# Patient Record
Sex: Male | Born: 1985 | Hispanic: No | Marital: Single | Smoking: Never smoker
Health system: Southern US, Community
[De-identification: ages and names within clinical notes are randomized; demographics above are authoritative.]

## PROBLEM LIST (undated history)

## (undated) DIAGNOSIS — F419 Anxiety disorder, unspecified: Secondary | ICD-10-CM

## (undated) HISTORY — DX: Anxiety disorder, unspecified: F41.9

---

## 2009-10-04 ENCOUNTER — Ambulatory Visit: Payer: Self-pay | Admitting: Internal Medicine

## 2009-10-04 DIAGNOSIS — F411 Generalized anxiety disorder: Secondary | ICD-10-CM

## 2010-03-06 NOTE — Assessment & Plan Note (Signed)
Summary: NEW/ [employer] /NWS  #   Vital Signs:  Patient profile:   25 year old male Height:      71 inches Weight:      237 pounds BMI:     33.17 O2 Sat:      97 % on Room air Temp:     98.6 degrees F oral Pulse rate:   65 / minute Pulse rhythm:   regular BP sitting:   130 / 70  (left arm) Cuff size:   large  Vitals Entered By: Rock Nephew CMA (October 04, 2009 3:08 PM)  Nutrition Counseling: Patient's BMI is greater than 25 and therefore counseled on weight management options.  O2 Flow:  Room air  Primary Care Provider:  Etta Grandchild MD   History of Present Illness: New to me this gentleman needs a new PCP. He has been on meds for anxiety and panic for 2 years and is doing well.  Preventive Screening-Counseling & Management  Alcohol-Tobacco     Alcohol drinks/day: <1     Alcohol type: beer     >5/day in last 3 mos: no     Alcohol Counseling: not indicated; patient does not drink     Feels need to cut down: no     Feels annoyed by complaints: no     Feels guilty re: drinking: no     Needs 'eye opener' in am: no     Smoking Status: never     Tobacco Counseling: not indicated; no tobacco use  Caffeine-Diet-Exercise     Does Patient Exercise: yes  Hep-HIV-STD-Contraception     Hepatitis Risk: no risk noted     HIV Risk: no risk noted     STD Risk: no risk noted     SBE monthly: no     TSE monthly: yes     Testicular SE Education/Counseling to perform regular STE  Safety-Violence-Falls     Seat Belt Use: yes     Helmet Use: yes     Firearms in the Home: no firearms in the home     Smoke Detectors: yes      Sexual History:  currently monogamous.        Drug Use:  never and no.        Blood Transfusions:  no.    Medications Prior to Update: 1)  None  Current Medications (verified): 1)  Lexapro 10 Mg Tabs (Escitalopram Oxalate) .... Take 1 Tablet By Mouth Once A Day 2)  Clonazepam 0.5 Mg Tabs (Clonazepam) .... One By Mouth Three Times A Day As Needed For  Anxiety  Allergies (verified): No Known Drug Allergies  Past History:  Past Medical History: Anxiety  Past Surgical History: Denies surgical history  Family History: Reviewed history and no changes required. Family History of Alcoholism/Addiction Family History of Arthritis Family History High cholesterol Family History Hypertension Family History Stroke  Social History: Occupation: Contractor Single with a steady girlfriend Alcohol use-yes Drug use-no Regular exercise-yes Drug Use:  never, no Does Patient Exercise:  yes Education:  Environmental manager Use:  yes Smoking Status:  never Hepatitis Risk:  no risk noted HIV Risk:  no risk noted STD Risk:  no risk noted Sexual History:  currently monogamous Blood Transfusions:  no  Review of Systems  The patient denies chest pain, syncope, dyspnea on exertion, peripheral edema, prolonged cough, headaches, hemoptysis, abdominal pain, and depression.    Physical Exam  General:  alert, well-developed, well-nourished, well-hydrated,  appropriate dress, normal appearance, healthy-appearing, and cooperative to examination.   Head:  normocephalic, atraumatic, no abnormalities observed, and no abnormalities palpated.   Eyes:  vision grossly intact, pupils equal, pupils round, and pupils reactive to light.   Mouth:  Oral mucosa and oropharynx without lesions or exudates.  Teeth in good repair. Neck:  supple, full ROM, no masses, no thyromegaly, no thyroid nodules or tenderness, no JVD, normal carotid upstroke, no carotid bruits, no cervical lymphadenopathy, and no neck tenderness.   Lungs:  normal respiratory effort, no intercostal retractions, no accessory muscle use, normal breath sounds, no dullness, no fremitus, no crackles, and no wheezes.   Heart:  normal rate, regular rhythm, no murmur, no gallop, no rub, and no JVD.   Abdomen:  soft, non-tender, normal bowel sounds, no distention, no masses, no guarding, no  rigidity, no rebound tenderness, no abdominal hernia, no inguinal hernia, no hepatomegaly, and no splenomegaly.   Msk:  No deformity or scoliosis noted of thoracic or lumbar spine.   Pulses:  R and L carotid,radial,femoral,dorsalis pedis and posterior tibial pulses are full and equal bilaterally Extremities:  No clubbing, cyanosis, edema, or deformity noted with normal full range of motion of all joints.   Neurologic:  No cranial nerve deficits noted. Station and gait are normal. Plantar reflexes are down-going bilaterally. DTRs are symmetrical throughout. Sensory, motor and coordinative functions appear intact. Skin:  Intact without suspicious lesions or rashes Cervical Nodes:  no anterior cervical adenopathy and no posterior cervical adenopathy.   Axillary Nodes:  no R axillary adenopathy and no L axillary adenopathy.   Inguinal Nodes:  no R inguinal adenopathy and no L inguinal adenopathy.   Psych:  Oriented X3, memory intact for recent and remote, normally interactive, good eye contact, not depressed appearing, not agitated, not suicidal, not homicidal, and slightly anxious.     Impression & Recommendations:  Problem # 1:  ANXIETY (ICD-300.00) Assessment Unchanged  he was referred to Vernell Leep for Psychotherapy to deal with anxiety, performance issues, and panic. His updated medication list for this problem includes:    Lexapro 10 Mg Tabs (Escitalopram oxalate) .Marland Kitchen... Take 1 tablet by mouth once a day    Clonazepam 0.5 Mg Tabs (Clonazepam) ..... One by mouth three times a day as needed for anxiety  Discussed medication use and relaxation techniques.   Complete Medication List: 1)  Lexapro 10 Mg Tabs (Escitalopram oxalate) .... Take 1 tablet by mouth once a day 2)  Clonazepam 0.5 Mg Tabs (Clonazepam) .... One by mouth three times a day as needed for anxiety  Patient Instructions: 1)  Please schedule a follow-up appointment in 6 months. Prescriptions: CLONAZEPAM 0.5 MG TABS  (CLONAZEPAM) one by mouth three times a day as needed for anxiety  #90 x 5   Entered and Authorized by:   Etta Grandchild MD   Signed by:   Etta Grandchild MD on 10/04/2009   Method used:   Print then Give to Patient   RxID:   [id number] LEXAPRO 10 MG TABS (ESCITALOPRAM OXALATE) Take 1 tablet by mouth once a day  #30 x 11   Entered and Authorized by:   Etta Grandchild MD   Signed by:   Etta Grandchild MD on 10/04/2009   Method used:   Print then Give to Patient   RxID:   [id number]   Preventive Care Screening  Last Tetanus Booster:    Date:  02/05/2007    Results:  Historical

## 2010-03-08 ENCOUNTER — Telehealth: Payer: Self-pay | Admitting: Internal Medicine

## 2010-03-14 NOTE — Progress Notes (Signed)
Summary: RF  Phone Note Refill Request   Refills Requested: Medication #1:  CLONAZEPAM 0.5 MG TABS one by mouth three times a day as needed for anxiety. Next office visit is 03/30/10  Initial call taken by: Lamar Sprinkles, CMA,  March 08, 2010 2:32 PM    Prescriptions: CLONAZEPAM 0.5 MG TABS (CLONAZEPAM) one by mouth three times a day as needed for anxiety  #90 x 0   Entered and Authorized by:   Etta Grandchild MD   Signed by:   Etta Grandchild MD on 03/08/2010   Method used:   Historical   RxID:   [id number]  Rx called in to [facility] [place] [phone]/la

## 2010-03-30 ENCOUNTER — Other Ambulatory Visit: Payer: BC Managed Care – PPO

## 2010-03-30 ENCOUNTER — Encounter: Payer: Self-pay | Admitting: Internal Medicine

## 2010-03-30 ENCOUNTER — Other Ambulatory Visit: Payer: Self-pay | Admitting: Internal Medicine

## 2010-03-30 ENCOUNTER — Encounter (INDEPENDENT_AMBULATORY_CARE_PROVIDER_SITE_OTHER): Payer: BC Managed Care – PPO | Admitting: Internal Medicine

## 2010-03-30 DIAGNOSIS — Z0000 Encounter for general adult medical examination without abnormal findings: Secondary | ICD-10-CM

## 2010-03-30 DIAGNOSIS — T783XXA Angioneurotic edema, initial encounter: Secondary | ICD-10-CM | POA: Insufficient documentation

## 2010-03-30 DIAGNOSIS — F411 Generalized anxiety disorder: Secondary | ICD-10-CM

## 2010-03-30 LAB — CBC WITH DIFFERENTIAL/PLATELET
Basophils Absolute: 0 10*3/uL (ref 0.0–0.1)
Eosinophils Absolute: 0.4 10*3/uL (ref 0.0–0.7)
Eosinophils Relative: 6.6 % — ABNORMAL HIGH (ref 0.0–5.0)
HCT: 49 % (ref 39.0–52.0)
Lymphs Abs: 1.7 10*3/uL (ref 0.7–4.0)
MCHC: 34.9 g/dL (ref 30.0–36.0)
MCV: 91.4 fl (ref 78.0–100.0)
Monocytes Absolute: 0.6 10*3/uL (ref 0.1–1.0)
Neutrophils Relative %: 57 % (ref 43.0–77.0)
Platelets: 171 10*3/uL (ref 150.0–400.0)
RDW: 13.7 % (ref 11.5–14.6)
WBC: 6.4 10*3/uL (ref 4.5–10.5)

## 2010-03-30 LAB — HEPATIC FUNCTION PANEL
AST: 25 U/L (ref 0–37)
Albumin: 4 g/dL (ref 3.5–5.2)
Total Bilirubin: 1.9 mg/dL — ABNORMAL HIGH (ref 0.3–1.2)

## 2010-03-30 LAB — BASIC METABOLIC PANEL
BUN: 11 mg/dL (ref 6–23)
Chloride: 107 mEq/L (ref 96–112)
Creatinine, Ser: 1.2 mg/dL (ref 0.4–1.5)
Glucose, Bld: 89 mg/dL (ref 70–99)
Potassium: 4.9 mEq/L (ref 3.5–5.1)

## 2010-03-30 LAB — CONVERTED CEMR LAB: IgE (Immunoglobulin E), Serum: 41.9 intl units/mL (ref 0.0–180.0)

## 2010-03-30 LAB — LIPID PANEL
Cholesterol: 176 mg/dL (ref 0–200)
LDL Cholesterol: 109 mg/dL — ABNORMAL HIGH (ref 0–99)
Triglycerides: 66 mg/dL (ref 0.0–149.0)

## 2010-03-30 LAB — TSH: TSH: 1.57 u[IU]/mL (ref 0.35–5.50)

## 2010-04-03 NOTE — Assessment & Plan Note (Signed)
Summary: PHYSICAL / TO COME FASTING /NWS  #   Vital Signs:  Patient profile:   25 year old male Height:      71 inches (180.34 cm) Weight:      255.13 pounds (115.97 kg) BMI:     35.71 O2 Sat:      97 % on Room air Temp:     97.2 degrees F (36.22 degrees C) oral Pulse rate:   80 / minute Pulse rhythm:   regular Resp:     14 per minute BP sitting:   138 / 70  (left arm) Cuff size:   large  Vitals Entered By: Burnard Leigh CMA(AAMA) (March 30, 2010 9:17 AM)  Nutrition Counseling: Patient's BMI is greater than 25 and therefore counseled on weight management options.  O2 Flow:  Room air CC: CPE/Wellness Visit/sls,cma Is Patient Diabetic? No Pain Assessment Patient in pain? no      Comments Pt would like to discuss referral to Allergist for shellfish testing. Pt would like to discuss taking MV w/meds.   Primary Care Provider:  Etta Grandchild MD  CC:  CPE/Wellness Visit/sls and cma.  History of Present Illness: He returns for a complete physical but asks that I test him for shellfish allergy - he tells me a story about eating some crab 4 years ago and developing mouth, lip, tongue swelling and having some episodes of hives after eating shrimp.  Preventive Screening-Counseling & Management  Alcohol-Tobacco     Alcohol drinks/day: <1     Alcohol type: beer     >5/day in last 3 mos: no     Alcohol Counseling: not indicated; patient does not drink     Feels need to cut down: no     Feels annoyed by complaints: no     Feels guilty re: drinking: no     Needs 'eye opener' in am: no     Smoking Status: never     Tobacco Counseling: not indicated; no tobacco use  Hep-HIV-STD-Contraception     Hepatitis Risk: no risk noted     HIV Risk: no risk noted     STD Risk: no risk noted     SBE monthly: no     TSE monthly: yes     Testicular SE Education/Counseling to perform regular STE      Sexual History:  currently monogamous.        Drug Use:  never and no.     Blood Transfusions:  no.    Clinical Review Panels:  Immunizations   Last Tetanus Booster:  Historical (02/05/2007)   Medications Prior to Update: 1)  Lexapro 10 Mg Tabs (Escitalopram Oxalate) .... Take 1 Tablet By Mouth Once A Day 2)  Clonazepam 0.5 Mg Tabs (Clonazepam) .... One By Mouth Three Times A Day As Needed For Anxiety  Current Medications (verified): 1)  Clonazepam 0.5 Mg Tabs (Clonazepam) .... One By Mouth Three Times A Day As Needed For Anxiety  Allergies (verified): No Known Drug Allergies  Past History:  Past Medical History: Last updated: 10/04/2009 Anxiety  Past Surgical History: Last updated: 10/04/2009 Denies surgical history  Family History: Last updated: 10/04/2009 Family History of Alcoholism/Addiction Family History of Arthritis Family History High cholesterol Family History Hypertension Family History Stroke  Social History: Last updated: 10/04/2009 Occupation: supplier relations specialist Single with a steady girlfriend Alcohol use-yes Drug use-no Regular exercise-yes  Risk Factors: Alcohol Use: <1 (03/30/2010) >5 drinks/d w/in last 3 months: no (03/30/2010) Exercise: yes (10/04/2009)  Risk Factors: Smoking Status: never (03/30/2010)  Family History: Reviewed history from 10/04/2009 and no changes required. Family History of Alcoholism/Addiction Family History of Arthritis Family History High cholesterol Family History Hypertension Family History Stroke  Social History: Reviewed history from 10/04/2009 and no changes required. 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  The patient denies anorexia, fever, weight loss, chest pain, syncope, dyspnea on exertion, peripheral edema, prolonged cough, headaches, hemoptysis, abdominal pain, hematuria, suspicious skin lesions, depression,  enlarged lymph nodes, and testicular masses.   Derm:  Denies changes in color of skin, changes in nail beds, dryness, excessive perspiration, flushing, itching, and rash. Psych:  Complains of anxiety; denies alternate hallucination ( auditory/visual), depression, easily angered, easily tearful, irritability, mental problems, panic attacks, sense of great danger, suicidal thoughts/plans, thoughts of violence, unusual visions or sounds, and thoughts /plans of harming others.  Physical Exam  General:  alert, well-developed, well-nourished, well-hydrated, appropriate dress, normal appearance, healthy-appearing, and cooperative to examination.   Head:  normocephalic, atraumatic, no abnormalities observed, and no abnormalities palpated.   Eyes:  vision grossly intact, pupils equal, pupils round, and pupils reactive to light.   Mouth:  Oral mucosa and oropharynx without lesions or exudates.  Teeth in good repair. Neck:  supple, full ROM, no masses, no thyromegaly, no thyroid nodules or tenderness, no JVD, normal carotid upstroke, no carotid bruits, no cervical lymphadenopathy, and no neck tenderness.   Lungs:  normal respiratory effort, no intercostal retractions, no accessory muscle use, normal breath sounds, no dullness, no fremitus, no crackles, and no wheezes.   Heart:  normal rate, regular rhythm, no murmur, no gallop, no rub, and no JVD.   Abdomen:  soft, non-tender, normal bowel sounds, no distention, no masses, no guarding, no rigidity, no rebound tenderness, no abdominal hernia, no inguinal hernia, no hepatomegaly, and no splenomegaly.   Genitalia:  uncircumcised, no hydrocele, no varicocele, no scrotal masses, no testicular masses or atrophy, no cutaneous lesions, and no urethral discharge.   Msk:  No deformity or scoliosis noted of thoracic or lumbar spine.   Pulses:  R and L carotid,radial,femoral,dorsalis pedis and posterior tibial pulses are full and equal bilaterally Extremities:  No  clubbing, cyanosis, edema, or deformity noted with normal full range of motion of all joints.   Neurologic:  No cranial nerve deficits noted. Station and gait are normal. Plantar reflexes are down-going bilaterally. DTRs are symmetrical throughout. Sensory, motor and coordinative functions appear intact. Skin:  Intact without suspicious lesions or rashes Cervical Nodes:  no anterior cervical adenopathy and no posterior cervical adenopathy.   Axillary Nodes:  no R axillary adenopathy and no L axillary adenopathy.   Psych:  Oriented X3, memory intact for recent and remote, normally interactive, good eye contact, not anxious appearing, not depressed appearing, not agitated, not suicidal, and not homicidal.     Impression & Recommendations:  Problem # 1:  ANGIONEUROTIC EDEMA (ICD-995.1) Assessment New  Orders: Venipuncture (04540) T-Food Allergy Profile Specific IgE (86003/[address]) TLB-Lipid Panel (80061-LIPID) TLB-BMP (Basic Metabolic Panel-BMET) (80048-METABOL) TLB-CBC Platelet - w/Differential (85025-CBCD) TLB-TSH (Thyroid Stimulating Hormone) (84443-TSH) TLB-Hepatic/Liver Function Pnl (80076-HEPATIC)  Problem # 2:  ROUTINE GENERAL MEDICAL EXAM@HEALTH  CARE FACL (ICD-V70.0) Assessment: New  Orders: Venipuncture (98119) T-Food Allergy Profile Specific IgE (86003/[address]) TLB-Lipid Panel (80061-LIPID) TLB-BMP (Basic Metabolic Panel-BMET) (80048-METABOL) TLB-CBC Platelet - w/Differential (85025-CBCD) TLB-TSH (Thyroid Stimulating Hormone) (84443-TSH) TLB-Hepatic/Liver  Function Pnl (80076-HEPATIC)  Td Booster: Historical (02/05/2007)    Discussed using sunscreen, use of alcohol, drug use, self testicular exam, routine dental care, routine eye care, routine physical exam, seat belts, multiple vitamins,  and recommendations for immunizations.  Discussed exercise and checking cholesterol.  Discussed gun safety, safe sex, and contraception.   Problem # 3:  ANXIETY  (ICD-300.00) Assessment: Improved  The following medications were removed from the medication list:    Lexapro 10 Mg Tabs (Escitalopram oxalate) .Marland Kitchen... Take 1 tablet by mouth once a day His updated medication list for this problem includes:    Clonazepam 0.5 Mg Tabs (Clonazepam) ..... One by mouth three times a day as needed for anxiety  Discussed medication use and relaxation techniques.   Complete Medication List: 1)  Clonazepam 0.5 Mg Tabs (Clonazepam) .... One by mouth three times a day as needed for anxiety  Patient Instructions: 1)  Please schedule a follow-up appointment as needed. 2)  It is important that you exercise regularly at least 20 minutes 5 times a week. If you develop chest pain, have severe difficulty breathing, or feel very tired , stop exercising immediately and seek medical attention. 3)  You need to lose weight. Consider a lower calorie diet and regular exercise.  4)  If you could be exposed to sexually transmitted diseases, you should use a condom. Prescriptions: CLONAZEPAM 0.5 MG TABS (CLONAZEPAM) one by mouth three times a day as needed for anxiety  #90 x 5   Entered and Authorized by:   Etta Grandchild MD   Signed by:   Etta Grandchild MD on 03/30/2010   Method used:   Print then Give to Patient   RxID:   [id number]    Orders Added: 1)  Venipuncture [01027] 2)  T-Food Allergy Profile Specific IgE [86003/[address]] 3)  TLB-Lipid Panel [80061-LIPID] 4)  TLB-BMP (Basic Metabolic Panel-BMET) [80048-METABOL] 5)  TLB-CBC Platelet - w/Differential [85025-CBCD] 6)  TLB-TSH (Thyroid Stimulating Hormone) [84443-TSH] 7)  TLB-Hepatic/Liver Function Pnl [80076-HEPATIC] 8)  Est. Patient 18-39 years [99395] 9)  Est. Patient Level III [25366]

## 2010-04-03 NOTE — Letter (Signed)
Summary: Lipid Letter  [facility]  [address] [place], [place] [address]   Phone: [phone]  Fax: [phone]    03/30/2010  Robert Werner [address] [place], [place]  [address]  Dear Robert Werner:  We have carefully reviewed your last lipid profile from 03/30/2010 and the results are noted below with a summary of recommendations for lipid management.    Cholesterol:       176     Goal: <200   HDL "good" Cholesterol:   56.43     Goal: >40   LDL "bad" Cholesterol:   109     Goal: <130   Triglycerides:       66.0     Goal: <150    EXCELLENT RESULTS, food allergy tests are not back yet.    TLC Diet (Therapeutic Lifestyle Change): Saturated Fats & Transfatty acids should be kept < 7% of total calories ***Reduce Saturated Fats Polyunstaurated Fat can be up to 10% of total calories Monounsaturated Fat Fat can be up to 20% of total calories Total Fat should be no greater than 25-35% of total calories Carbohydrates should be 50-60% of total calories Protein should be approximately 15% of total calories Fiber should be at least 20-30 grams a day ***Increased fiber may help lower LDL Total Cholesterol should be < 200mg /day Consider adding plant stanol/sterols to diet (example: Benacol spread) ***A higher intake of unsaturated fat may reduce Triglycerides and Increase HDL    Adjunctive Measures (may lower LIPIDS and reduce risk of Heart Attack) include: Aerobic Exercise (20-30 minutes 3-4 times a week) Limit Alcohol Consumption Weight Reduction Aspirin 75-81 mg a day by mouth (if not allergic or contraindicated) Dietary Fiber 20-30 grams a day by mouth     Current Medications: 1)    Clonazepam 0.5 Mg Tabs (Clonazepam) .... One by mouth three times a day as needed for anxiety  If you have any questions, please call. We appreciate being able to work with you.   Sincerely,    [facility] Etta Grandchild MD

## 2010-05-14 ENCOUNTER — Telehealth: Payer: Self-pay | Admitting: *Deleted

## 2010-05-14 NOTE — Telephone Encounter (Signed)
It is ok for an early refill

## 2010-05-14 NOTE — Telephone Encounter (Signed)
Called pharmacy and gave PO per Dr Yetta Barre for early refill authorization. Pt informed.

## 2010-05-14 NOTE — Telephone Encounter (Signed)
Pt is not due for RF of clonazepam until Saturday. He left vm req early refill. Pt is out of med now due to taking more than tid b/c of increased stress in his life/work. Please advise.

## 2010-05-22 ENCOUNTER — Other Ambulatory Visit: Payer: Self-pay | Admitting: Internal Medicine

## 2010-05-23 ENCOUNTER — Other Ambulatory Visit: Payer: Self-pay | Admitting: Internal Medicine

## 2010-06-22 ENCOUNTER — Telehealth: Payer: Self-pay | Admitting: *Deleted

## 2010-06-22 ENCOUNTER — Other Ambulatory Visit: Payer: Self-pay | Admitting: Internal Medicine

## 2010-06-22 MED ORDER — CLONAZEPAM 0.5 MG PO TABS: 0.5000 mg | ORAL_TABLET | Freq: Three times a day (TID) | ORAL | Status: DC | PRN

## 2010-06-22 NOTE — Telephone Encounter (Signed)
Pt told me RX for klonopin written at [facility] on 2/24 by dr Yetta Barre (w/5rfs) was dropped off at [facility]. [facility] and [facility] have no record of the 2/24 rx for klonpin. He says he uses NO other pharmacies and did drop off the RX at [facility].   Patient requesting to know if MD would authorize at least enough to get thru Monday to wait on response from PCP?

## 2010-06-22 NOTE — Telephone Encounter (Signed)
Patient requesting RF of klonopin 0.5 tid prn. Please advise. (Last OV 03/2010)

## 2010-06-22 NOTE — Telephone Encounter (Signed)
Printed RX shredded - Called into pharm. Patient informed and advised to f/u with Dr Yetta Barre

## 2010-06-22 NOTE — Telephone Encounter (Signed)
Will call pharm - Pt aware we are working on this.

## 2010-06-22 NOTE — Telephone Encounter (Signed)
[facility] shows last rx for One months and 5 refills just done feb 24  Too soon for refills

## 2010-06-22 NOTE — Telephone Encounter (Signed)
I reviewed the [place] controlled substance database  The pt's statements appear to be authentic  Will go ahead with rx refill

## 2010-07-10 ENCOUNTER — Encounter: Payer: Self-pay | Admitting: Internal Medicine

## 2010-07-11 ENCOUNTER — Encounter: Payer: Self-pay | Admitting: Internal Medicine

## 2010-07-11 ENCOUNTER — Ambulatory Visit (INDEPENDENT_AMBULATORY_CARE_PROVIDER_SITE_OTHER): Payer: BC Managed Care – PPO | Admitting: Internal Medicine

## 2010-07-11 VITALS — BP 126/70 | HR 56 | Temp 98.6°F | Resp 16 | Wt 244.0 lb

## 2010-07-11 DIAGNOSIS — F411 Generalized anxiety disorder: Secondary | ICD-10-CM

## 2010-07-11 MED ORDER — CLONAZEPAM 0.5 MG PO TABS: 0.5000 mg | ORAL_TABLET | Freq: Three times a day (TID) | ORAL | Status: DC | PRN

## 2010-07-11 NOTE — Patient Instructions (Signed)
Anxiety and Panic Attacks Your caregiver has informed you that you are having an anxiety or panic attack. There may be many forms of this. Most of the time these attacks come suddenly and without warning. They come at any time of day, including periods of sleep, and at any time of life. They may be strong and unexplained. Although panic attacks are very scary, they are physically harmless. Sometimes the cause of your anxiety is not known. Anxiety is a protective mechanism of the body in its fight or flight mechanism. Most of these perceived danger situations are actually nonphysical situations (such as anxiety over losing a job). CAUSES The causes of an anxiety or panic attack are many. Panic attacks may occur in otherwise healthy people given a certain set of circumstances. There may be a genetic cause for panic attacks. Some medications may also have anxiety as a side effect. SYMPTOMS Some of the most common feelings are:  Intense terror.  Dizziness, feeling faint.   Hot and cold flashes.   Fear of going crazy.   Feelings that nothing is real.   Sweating.   Shaking.   Chest pain or a fast heartbeat (palpitations).  Smothering, choking sensations.   Feelings of impending doom and that death is near.   Tingling of extremities, this may be from over breathing.   Altered reality (derealization).   Being detached from yourself (depersonalization).   Several symptoms can be present to make up anxiety or panic attacks. DIAGNOSIS The evaluation by your caregiver will depend on the type of symptoms you are experiencing. The diagnosis of anxiety or pain attack is made when no physical illness can be determined to be a cause of the symptoms. TREATMENT Treatment to prevent anxiety and panic attacks may include:  Avoidance of circumstances that cause anxiety.   Reassurance and relaxation.   Regular exercise.   Relaxation therapies, such as yoga.   Psychotherapy with a psychiatrist  or therapist.   Avoidance of caffeine, alcohol and illegal drugs.   Prescribed medication.  SEEK IMMEDIATE MEDICAL CARE IF:  You experience panic attack symptoms that are different than your usual symptoms.   You have any worsening or concerning symptoms.  Document Released: 01/21/2005 Document Re-Released: 07/11/2009 Exit[employer] Patient Information 2011 [employer]. 

## 2010-07-11 NOTE — Assessment & Plan Note (Addendum)
Continue current meds, it sounds like it is working well for him

## 2010-07-11 NOTE — Progress Notes (Signed)
  Subjective:    Patient ID: Robert Werner, male    DOB: 1985/07/04, 25 y.o.   MRN: [id number]  Anxiety Presents for follow-up visit. Symptoms include insomnia, muscle tension, nervous/anxious behavior and panic. Patient reports no chest pain, compulsions, confusion, decreased concentration, depressed mood, dizziness, dry mouth, excessive worry, feeling of choking, hyperventilation, impotence, irritability, malaise, nausea, obsessions, palpitations, restlessness, shortness of breath or suicidal ideas. Symptoms occur occasionally. The severity of symptoms is moderate. The quality of sleep is good. Nighttime awakenings: occasional.   Compliance with medications is 76-100%.      Review of Systems  Constitutional: Negative for fever, irritability and unexpected weight change.  HENT: Negative for facial swelling, neck pain and neck stiffness.   Eyes: Negative for photophobia and visual disturbance.  Respiratory: Negative for apnea, cough, shortness of breath and wheezing.   Cardiovascular: Negative for chest pain and palpitations.  Gastrointestinal: Negative for nausea, vomiting, abdominal pain, diarrhea and constipation.  Genitourinary: Negative for dysuria, urgency, frequency, hematuria, flank pain, impotence, decreased urine volume, enuresis and difficulty urinating.  Musculoskeletal: Negative for myalgias, back pain, joint swelling, arthralgias and gait problem.  Skin: Negative for color change, pallor and rash.  Neurological: Negative for dizziness, tremors, seizures, syncope, facial asymmetry, speech difficulty, weakness, light-headedness, numbness and headaches.  Hematological: Negative for adenopathy. Does not bruise/bleed easily.  Psychiatric/Behavioral: Negative for suicidal ideas, hallucinations, behavioral problems, confusion, sleep disturbance, self-injury, dysphoric mood, decreased concentration and agitation. The patient is nervous/anxious and has insomnia. The patient is not hyperactive.         Objective:   Physical Exam  [vitalsreviewed. Constitutional: He is oriented to person, place, and time. He appears well-developed and well-nourished. No distress.  HENT:  Head: Normocephalic and atraumatic.  Right Ear: External ear normal.  Left Ear: External ear normal.  Nose: Nose normal.  Mouth/Throat: Oropharynx is clear and moist. No oropharyngeal exudate.  Eyes: Conjunctivae and EOM are normal. Pupils are equal, round, and reactive to light. Right eye exhibits no discharge. Left eye exhibits no discharge. No scleral icterus.  Neck: Normal range of motion. Neck supple. No JVD present. No tracheal deviation present. No thyromegaly present.  Cardiovascular: Normal rate, regular rhythm, normal heart sounds and intact distal pulses.  Exam reveals no gallop and no friction rub.   No murmur heard. Pulmonary/Chest: Effort normal and breath sounds normal. No stridor. No respiratory distress. He has no wheezes. He has no rales. He exhibits no tenderness.  Abdominal: Soft. Bowel sounds are normal. He exhibits no distension and no mass. There is no tenderness. There is no rebound and no guarding.  Musculoskeletal: Normal range of motion. He exhibits no edema and no tenderness.  Lymphadenopathy:    He has no cervical adenopathy.  Neurological: He is alert and oriented to person, place, and time. He has normal reflexes. He displays normal reflexes. No cranial nerve deficit. He exhibits normal muscle tone. Coordination normal.  Skin: Skin is warm and dry. No rash noted. He is not diaphoretic. No erythema. No pallor.  Psychiatric: He has a normal mood and affect. His behavior is normal. Judgment and thought content normal.          Assessment & Plan:

## 2010-07-12 ENCOUNTER — Telehealth: Payer: Self-pay

## 2010-07-12 NOTE — Telephone Encounter (Signed)
[facility] Triage Call Report Triage Record Num: [id number] Operator: April Finney Patient Name: Robert Werner Call Date & Time: 07/11/2010 5:14:08PM Patient Phone: [phone] PCP: Sanda Linger Patient Gender: Male PCP Fax : Patient DOB: 1986/01/08 Practice Name: [facility] Reason for Call: [name] calling about given Rx Clonazepam that was written today by Dr.Jones and pharmacy will not fill it due to it being early. Called [facility] [phone] and he picked up 90 pills on 06/22/10. Rx is for Clonazepam 0.5mg  tid prn. Pharmacy will need approval from Dr.Jones. Instructed [name] to call back in am when office opens. Protocol(s) Used: Office Note Recommended Outcome per Protocol: Information Noted and Sent to Office Reason for Outcome: Caller information to office Care Advice: ~ 07/11/2010 5:25:09PM Page 1 of 1 [id number]

## 2010-11-23 ENCOUNTER — Other Ambulatory Visit: Payer: Self-pay | Admitting: Internal Medicine

## 2010-11-23 NOTE — Telephone Encounter (Signed)
rx called to [facility], returned call to pt/ no answer or vm. Closing phone until pt call back

## 2010-12-10 ENCOUNTER — Other Ambulatory Visit: Payer: Self-pay | Admitting: *Deleted

## 2010-12-10 NOTE — Telephone Encounter (Signed)
Pt is requesting early refill on clonazepam.  He is taking extra.  Pt states it is 4th quarter at his work and he is working 12 hour days and is very stressed.  Please advise.  Pt is aware Dr. Yetta Barre out until tomorrow. [facility]

## 2010-12-11 MED ORDER — CLONAZEPAM 0.5 MG PO TABS: 0.5000 mg | ORAL_TABLET | Freq: Three times a day (TID) | ORAL | Status: DC | PRN

## 2010-12-11 NOTE — Telephone Encounter (Signed)
Done           Patient Informed

## 2010-12-11 NOTE — Telephone Encounter (Signed)
ok 

## 2011-02-04 ENCOUNTER — Other Ambulatory Visit: Payer: Self-pay | Admitting: Internal Medicine

## 2011-03-05 ENCOUNTER — Other Ambulatory Visit: Payer: Self-pay | Admitting: Internal Medicine

## 2011-03-29 ENCOUNTER — Other Ambulatory Visit: Payer: Self-pay | Admitting: Internal Medicine

## 2011-04-01 ENCOUNTER — Encounter: Payer: Self-pay | Admitting: Internal Medicine

## 2011-04-01 ENCOUNTER — Ambulatory Visit (INDEPENDENT_AMBULATORY_CARE_PROVIDER_SITE_OTHER): Payer: BC Managed Care – PPO | Admitting: Internal Medicine

## 2011-04-01 ENCOUNTER — Other Ambulatory Visit (INDEPENDENT_AMBULATORY_CARE_PROVIDER_SITE_OTHER): Payer: BC Managed Care – PPO

## 2011-04-01 VITALS — BP 134/82 | HR 74 | Temp 97.1°F | Resp 16 | Wt 237.0 lb

## 2011-04-01 DIAGNOSIS — F411 Generalized anxiety disorder: Secondary | ICD-10-CM

## 2011-04-01 DIAGNOSIS — Z0000 Encounter for general adult medical examination without abnormal findings: Secondary | ICD-10-CM | POA: Insufficient documentation

## 2011-04-01 LAB — COMPREHENSIVE METABOLIC PANEL
ALT: 14 U/L (ref 0–53)
CO2: 29 mEq/L (ref 19–32)
Calcium: 9.3 mg/dL (ref 8.4–10.5)
Chloride: 110 mEq/L (ref 96–112)
Creatinine, Ser: 1.3 mg/dL (ref 0.4–1.5)
GFR: 69.14 mL/min (ref >60.00)
Glucose, Bld: 101 mg/dL — ABNORMAL HIGH (ref 70–99)
Sodium: 143 mEq/L (ref 135–145)
Total Protein: 6.5 g/dL (ref 6.0–8.3)

## 2011-04-01 LAB — URINALYSIS, ROUTINE W REFLEX MICROSCOPIC
Bilirubin Urine: NEGATIVE
Hgb urine dipstick: NEGATIVE
Ketones, ur: NEGATIVE
Total Protein, Urine: NEGATIVE
Urine Glucose: NEGATIVE

## 2011-04-01 LAB — CBC WITH DIFFERENTIAL/PLATELET
Basophils Absolute: 0 10*3/uL (ref 0.0–0.1)
Eosinophils Relative: 5.1 % — ABNORMAL HIGH (ref 0.0–5.0)
Lymphocytes Relative: 26.6 % (ref 12.0–46.0)
Monocytes Relative: 11.4 % (ref 3.0–12.0)
Platelets: 177 10*3/uL (ref 150.0–400.0)
RDW: 13.7 % (ref 11.5–14.6)
WBC: 5.9 10*3/uL (ref 4.5–10.5)

## 2011-04-01 LAB — LIPID PANEL
Cholesterol: 156 mg/dL (ref 0–200)
HDL: 56.5 mg/dL (ref >39.00)
LDL Cholesterol: 81 mg/dL (ref 0–99)
Triglycerides: 95 mg/dL (ref 0.0–149.0)
VLDL: 19 mg/dL (ref 0.0–40.0)

## 2011-04-01 MED ORDER — SERTRALINE HCL 50 MG PO TABS: 50.0000 mg | ORAL_TABLET | Freq: Every day | ORAL | Status: DC

## 2011-04-01 MED ORDER — CLONAZEPAM 1 MG PO TABS: 1.0000 mg | ORAL_TABLET | Freq: Three times a day (TID) | ORAL | Status: DC

## 2011-04-01 NOTE — Patient Instructions (Signed)
Health Maintenance, Males A healthy lifestyle and preventative care can promote health and wellness.  Maintain regular health, dental, and eye exams.   Eat a healthy diet. Foods like vegetables, fruits, whole grains, low-fat dairy products, and lean protein foods contain the nutrients you need without too many calories. Decrease your intake of foods high in solid fats, added sugars, and salt. Get information about a proper diet from your caregiver, if necessary.   Regular physical exercise is one of the most important things you can do for your health. Most adults should get at least 150 minutes of moderate-intensity exercise (any activity that increases your heart rate and causes you to sweat) each week. In addition, most adults need muscle-strengthening exercises on 2 or more days a week.    Maintain a healthy weight. The body mass index (BMI) is a screening tool to identify possible weight problems. It provides an estimate of body fat based on height and weight. Your caregiver can help determine your BMI, and can help you achieve or maintain a healthy weight. For adults 20 years and older:   A BMI below 18.5 is considered underweight.   A BMI of 18.5 to 24.9 is normal.   A BMI of 25 to 29.9 is considered overweight.   A BMI of 30 and above is considered obese.   Maintain normal blood lipids and cholesterol by exercising and minimizing your intake of saturated fat. Eat a balanced diet with plenty of fruits and vegetables. Blood tests for lipids and cholesterol should begin at age 20 and be repeated every 5 years. If your lipid or cholesterol levels are high, you are over 50, or you are a high risk for heart disease, you may need your cholesterol levels checked more frequently.Ongoing high lipid and cholesterol levels should be treated with medicines, if diet and exercise are not effective.   If you smoke, find out from your caregiver how to quit. If you do not use tobacco, do not start.    If you choose to drink alcohol, do not exceed 2 drinks per day. One drink is considered to be 12 ounces (355 mL) of beer, 5 ounces (148 mL) of wine, or 1.5 ounces (44 mL) of liquor.   Avoid use of street drugs. Do not share needles with anyone. Ask for help if you need support or instructions about stopping the use of drugs.   High blood pressure causes heart disease and increases the risk of stroke. Blood pressure should be checked at least every 1 to 2 years. Ongoing high blood pressure should be treated with medicines if weight loss and exercise are not effective.   If you are 45 to 26 years old, ask your caregiver if you should take aspirin to prevent heart disease.   Diabetes screening involves taking a blood sample to check your fasting blood sugar level. This should be done once every 3 years, after age 45, if you are within normal weight and without risk factors for diabetes. Testing should be considered at a younger age or be carried out more frequently if you are overweight and have at least 1 risk factor for diabetes.   Colorectal cancer can be detected and often prevented. Most routine colorectal cancer screening begins at the age of 50 and continues through age 75. However, your caregiver may recommend screening at an earlier age if you have risk factors for colon cancer. On a yearly basis, your caregiver may provide home test kits to check for hidden   blood in the stool. Use of a small camera at the end of a tube, to directly examine the colon (sigmoidoscopy or colonoscopy), can detect the earliest forms of colorectal cancer. Talk to your caregiver about this at age 50, when routine screening begins. Direct examination of the colon should be repeated every 5 to 10 years through age 75, unless early forms of pre-cancerous polyps or small growths are found.   Healthy men should no longer receive prostate-specific antigen (PSA) blood tests as part of routine cancer screening. Consult with  your caregiver about prostate cancer screening.   Practice safe sex. Use condoms and avoid high-risk sexual practices to reduce the spread of sexually transmitted infections (STIs).   Use sunscreen with a sun protection factor (SPF) of 30 or greater. Apply sunscreen liberally and repeatedly throughout the day. You should seek shade when your shadow is shorter than you. Protect yourself by wearing long sleeves, pants, a wide-brimmed hat, and sunglasses year round, whenever you are outdoors.   Notify your caregiver of new moles or changes in moles, especially if there is a change in shape or color. Also notify your caregiver if a mole is larger than the size of a pencil eraser.   A one-time screening for abdominal aortic aneurysm (AAA) and surgical repair of large AAAs by sound wave imaging (ultrasonography) is recommended for ages 65 to 75 years who are current or former smokers.   Stay current with your immunizations.  Document Released: 07/20/2007 Document Revised: 10/03/2010 Document Reviewed: 06/18/2010 [employer] Patient Information 2012 [employer]. 

## 2011-04-01 NOTE — Assessment & Plan Note (Signed)
Change klonopin dose and add on sertraline, he does not want to do psychotherapy

## 2011-04-01 NOTE — Assessment & Plan Note (Signed)
Exam done, labs ordered, pt ed material was given 

## 2011-04-01 NOTE — Progress Notes (Signed)
  Subjective:    Patient ID: Robert Werner, male    DOB: May 26, 1985, 26 y.o.   MRN: [id number]  HPI  He returns for a complete physical and he tells me that he has been under more stress at work and he feels like he needs a higher dose of klonopin.  Review of Systems  Constitutional: Negative.   HENT: Negative.   Eyes: Negative.   Respiratory: Negative.   Cardiovascular: Negative.   Gastrointestinal: Negative.   Genitourinary: Negative.   Musculoskeletal: Negative.   Skin: Negative.   Neurological: Negative.   Hematological: Negative.   Psychiatric/Behavioral: Negative for suicidal ideas, hallucinations, behavioral problems, confusion, sleep disturbance, self-injury, dysphoric mood, decreased concentration and agitation. The patient is nervous/anxious. The patient is not hyperactive.        Objective:   Physical Exam  Vitals reviewed. Constitutional: He is oriented to person, place, and time. He appears well-developed and well-nourished. No distress.  HENT:  Head: Normocephalic and atraumatic.  Mouth/Throat: Oropharynx is clear and moist. No oropharyngeal exudate.  Eyes: Conjunctivae are normal. Right eye exhibits no discharge. Left eye exhibits no discharge. No scleral icterus.  Neck: Normal range of motion. Neck supple. No JVD present. No tracheal deviation present. No thyromegaly present.  Cardiovascular: Normal rate, regular rhythm, normal heart sounds and intact distal pulses.  Exam reveals no gallop and no friction rub.   No murmur heard. Pulmonary/Chest: Effort normal and breath sounds normal. No stridor. No respiratory distress. He has no wheezes. He has no rales. He exhibits no tenderness.  Abdominal: Soft. Bowel sounds are normal. He exhibits no distension and no mass. There is no tenderness. There is no rebound and no guarding. Hernia confirmed negative in the right inguinal area and confirmed negative in the left inguinal area.  Genitourinary: Testes normal and penis  normal. Right testis shows no mass, no swelling and no tenderness. Right testis is descended. Left testis shows no mass, no swelling and no tenderness. Left testis is descended. Circumcised. No penile tenderness. No discharge found.  Musculoskeletal: Normal range of motion. He exhibits no edema and no tenderness.  Lymphadenopathy:    He has no cervical adenopathy.       Right: No inguinal adenopathy present.       Left: No inguinal adenopathy present.  Neurological: He is oriented to person, place, and time.  Skin: Skin is warm and dry. No rash noted. He is not diaphoretic. No erythema. No pallor.  Psychiatric: He has a normal mood and affect. His behavior is normal. Judgment and thought content normal.          Assessment & Plan:

## 2011-07-12 ENCOUNTER — Telehealth: Payer: Self-pay | Admitting: Internal Medicine

## 2011-07-12 ENCOUNTER — Other Ambulatory Visit: Payer: Self-pay | Admitting: Internal Medicine

## 2011-07-12 NOTE — Telephone Encounter (Signed)
No pain meds listed in chart. Clonazepam has been called in earlier

## 2011-07-12 NOTE — Telephone Encounter (Signed)
The pt called and is hoping to get his pain med early.  He states the pharmacy has to be notified for this to happen.  Thanks!

## 2011-07-12 NOTE — Telephone Encounter (Signed)
Robert Werner called in the klonopin today.  The pharmacy won't refill yet because it isn't due until June 10.  Robert Werner took the last pill this morning and is requesting that we call the pharmacy to ok an early refill.

## 2011-07-26 ENCOUNTER — Encounter: Payer: Self-pay | Admitting: Internal Medicine

## 2011-07-26 ENCOUNTER — Ambulatory Visit (INDEPENDENT_AMBULATORY_CARE_PROVIDER_SITE_OTHER): Payer: BC Managed Care – PPO | Admitting: Internal Medicine

## 2011-07-26 VITALS — BP 128/84 | HR 80 | Temp 98.4°F | Resp 16 | Wt 245.0 lb

## 2011-07-26 DIAGNOSIS — F411 Generalized anxiety disorder: Secondary | ICD-10-CM

## 2011-07-26 MED ORDER — CLONAZEPAM 1 MG PO TABS: 1.0000 mg | ORAL_TABLET | Freq: Three times a day (TID) | ORAL | Status: DC | PRN

## 2011-07-26 MED ORDER — SERTRALINE HCL 50 MG PO TABS: 50.0000 mg | ORAL_TABLET | Freq: Every day | ORAL | Status: DC

## 2011-07-26 NOTE — Patient Instructions (Signed)

## 2011-07-31 NOTE — Progress Notes (Signed)
  Subjective:    Patient ID: Robert Werner, male    DOB: 01-Aug-1985, 26 y.o.   MRN: [id number]  HPI He returns for f/up and to request a refill on Klonopin. He has been more stressed than usual over the last few weeks - he tells me that his [place] and [place] has developed insomnia and night terrors and that is affecting him as well. He has taken the klonopin up to 4 times a day and ran out early. He did not start zoloft as previously recommended.   Review of Systems  Constitutional: Negative for fever, chills, diaphoresis, activity change, appetite change, fatigue and unexpected weight change.  HENT: Negative.   Eyes: Negative.   Respiratory: Negative for cough, chest tightness, shortness of breath, wheezing and stridor.   Cardiovascular: Negative for chest pain and leg swelling.  Gastrointestinal: Negative for nausea, vomiting, abdominal pain, diarrhea, constipation and blood in stool.  Genitourinary: Negative.   Musculoskeletal: Negative.   Skin: Negative for color change, pallor, rash and wound.  Neurological: Negative.   Hematological: Negative for adenopathy. Does not bruise/bleed easily.  Psychiatric/Behavioral: Negative for suicidal ideas, hallucinations, behavioral problems, confusion, disturbed wake/sleep cycle, self-injury, dysphoric mood, decreased concentration and agitation. The patient is nervous/anxious. The patient is not hyperactive.        Objective:   Physical Exam  Vitals reviewed. Constitutional: He is oriented to person, place, and time. He appears well-developed and well-nourished. No distress.  HENT:  Head: Normocephalic and atraumatic.  Mouth/Throat: Oropharynx is clear and moist. No oropharyngeal exudate.  Eyes: Conjunctivae are normal. Right eye exhibits no discharge. Left eye exhibits no discharge. No scleral icterus.  Neck: Normal range of motion. Neck supple. No JVD present. No tracheal deviation present. No thyromegaly  present.  Cardiovascular: Normal rate, regular rhythm, normal heart sounds and intact distal pulses.  Exam reveals no gallop and no friction rub.   No murmur heard. Pulmonary/Chest: Effort normal and breath sounds normal. No stridor. No respiratory distress. He has no wheezes. He has no rales. He exhibits no tenderness.  Abdominal: Soft. Bowel sounds are normal. He exhibits no distension and no mass. There is no tenderness. There is no rebound and no guarding.  Musculoskeletal: Normal range of motion. He exhibits no edema and no tenderness.  Lymphadenopathy:    He has no cervical adenopathy.  Neurological: He is oriented to person, place, and time.  Skin: Skin is warm and dry. No rash noted. He is not diaphoretic. No erythema. No pallor.  Psychiatric: His behavior is normal. Judgment and thought content normal. His mood appears anxious. His affect is not angry, not blunt, not labile and not inappropriate. His speech is not rapid and/or pressured, not delayed and not tangential. He is not agitated, not aggressive, is not hyperactive, not slowed, not withdrawn, not actively hallucinating and not combative. Thought content is not paranoid and not delusional. Cognition and memory are normal. Cognition and memory are not impaired. He does not express impulsivity or inappropriate judgment. He does not exhibit a depressed mood. He expresses no homicidal and no suicidal ideation. He expresses no suicidal plans and no homicidal plans. He exhibits normal recent memory and normal remote memory. He is attentive.          Assessment & Plan:

## 2011-07-31 NOTE — Assessment & Plan Note (Signed)
I have asked him to start zoloft as previously directed, I have asked him to start psychotherapy, I requested that he limit taking klonopin as directed, he will stay in touch about any new or worsening symptoms

## 2011-10-01 ENCOUNTER — Telehealth: Payer: Self-pay | Admitting: Internal Medicine

## 2011-10-01 NOTE — Telephone Encounter (Signed)
Caller: Aiven/Patient; Patient Name: Robert Werner; PCP: Sanda Linger (Adults only); Best Callback Phone Number: [phone]; Reason for call: early medication refill.  Leaving for [place] 10/06/11 for 3 weeks; requests early refill of Clonazepam 1 mg; takes 1 po tid.  Last filled approx 09/15/11 but will not have enough for entire trip.  Last office visit 07/26/11.  Does not need Zoloft refill.  [facility]  Information noted and sent to [facility] for request (early) refill of prescribed medication with valid refills per Medication Question guideline.

## 2011-10-02 NOTE — Telephone Encounter (Signed)
no

## 2011-10-03 ENCOUNTER — Ambulatory Visit (INDEPENDENT_AMBULATORY_CARE_PROVIDER_SITE_OTHER): Payer: BC Managed Care – PPO | Admitting: Internal Medicine

## 2011-10-03 ENCOUNTER — Encounter: Payer: Self-pay | Admitting: Internal Medicine

## 2011-10-03 VITALS — BP 152/92 | HR 79 | Temp 98.4°F | Ht 67.0 in | Wt 246.8 lb

## 2011-10-03 DIAGNOSIS — F411 Generalized anxiety disorder: Secondary | ICD-10-CM

## 2011-10-03 DIAGNOSIS — R030 Elevated blood-pressure reading, without diagnosis of hypertension: Secondary | ICD-10-CM

## 2011-10-03 MED ORDER — CLONAZEPAM 1 MG PO TABS: 1.0000 mg | ORAL_TABLET | Freq: Three times a day (TID) | ORAL | Status: DC | PRN

## 2011-10-03 NOTE — Patient Instructions (Signed)
It was good to see you today. Early refill on your clonazepam given today for international travel than spans timefram of your usual refill date Take less clonazepam as you are able - you will take less only when and if YOU start to take less Continue sertraline as prescribed - this is for anxiety just as much as more depression follow up Dr Yetta Barre 2 months (before next prescription needed), sooner if problems

## 2011-10-03 NOTE — Assessment & Plan Note (Signed)
Significant symptoms pt feels precipitated by work and family stressors Concerns re: high dose habit forming meds - but on same for since 2009 Early refill requested due to [place] travel x 3 weeks, copy of tickets brought with pt to OV today - leaving before next refill is due - thus 30d supply given now Declines therapy due to time restraints Encouraged to comply with sertraline as rx'd and follow up PCP

## 2011-10-03 NOTE — Telephone Encounter (Signed)
Pt has called again requesting a response to early refill request. Left message on machine for pt to return call.

## 2011-10-03 NOTE — Telephone Encounter (Signed)
Patient notified per MD denial. Patient was very unhappy due to leaving out of the country on this Sunday. Patient transferred to scheduling to discuss appt with another avail MD due to PCP out of the office.

## 2011-10-03 NOTE — Progress Notes (Signed)
  Subjective:    Patient ID: Robert Werner, male    DOB: 1985/11/29, 26 y.o.   MRN: [id number]  HPI Here follow up anxiety - requesting early refill on BZs due to overseas travel for 3 weeks  Past Medical History  Diagnosis Date  . Anxiety     Review of Systems  Psychiatric/Behavioral: Negative for suicidal ideas, hallucinations, behavioral problems, self-injury, dysphoric mood and decreased concentration. The patient is nervous/anxious.        Objective:   Physical Exam BP 152/92  Pulse 79  Temp 98.4 F (36.9 C) (Oral)  Ht 5\' 7"  (1.702 m)  Wt 246 lb 12.8 oz (111.948 kg)  BMI 38.65 kg/m2  SpO2 97% Constitutional:  He appears well-developed and well-nourished. No distress, but slightly irritable.  Neck: Normal range of motion. Neck supple. No JVD present. No thyromegaly present.  Cardiovascular: Normal rate, regular rhythm and normal heart sounds.  No murmur heard. no BLE edema Pulmonary/Chest: Effort normal and breath sounds normal. No respiratory distress. no wheezes.  Psychiatric: he has a normal mood and affect. behavior is normal. Judgment and thought content normal, but irritable.   Lab Results  Component Value Date   WBC 5.9 04/01/2011   HGB 16.9 04/01/2011   HCT 50.6 04/01/2011   PLT 177.0 04/01/2011   GLUCOSE 101* 04/01/2011   CHOL 156 04/01/2011   TRIG 95.0 04/01/2011   HDL 56.50 04/01/2011   LDLCALC 81 04/01/2011   ALT 14 04/01/2011   AST 22 04/01/2011   NA 143 04/01/2011   K 4.4 04/01/2011   CL 110 04/01/2011   CREATININE 1.3 04/01/2011   BUN 11 04/01/2011   CO2 29 04/01/2011   TSH 1.57 03/30/2010        Assessment & Plan:  Time spent with pt today 25 minutes, greater than 50% time spent counseling patient on anxiety, appropriate treatment with sertraline and counseling (ie, not just for depression) and risks of chronic BZs with escalating doses (last increase 0.5-1mg  tabs TID 02/2011) - extensive medication review and review of [place] controlled substance registry: early  refills of clonazepam each month by few days but no duplicate rx or other providers  Elevated blood pressure - related to "stress" re upcoming trip and "extra" OV today Will not start meds for blood pressure at this time but recommended close follow up on same BP Readings from Last 3 Encounters:  10/03/11 152/92  07/26/11 128/84  04/01/11 134/82

## 2011-11-21 ENCOUNTER — Telehealth: Payer: Self-pay | Admitting: Internal Medicine

## 2011-11-21 NOTE — Telephone Encounter (Signed)
Patient would like to switch from Dr Yetta Barre to Dr Felicity Coyer, states that Dr Felicity Coyer said this was fine at his last visit but told patient I would have to double check with you, please advise?

## 2011-11-22 ENCOUNTER — Other Ambulatory Visit: Payer: Self-pay | Admitting: Internal Medicine

## 2011-11-22 NOTE — Telephone Encounter (Signed)
To forward request to PCP

## 2011-11-25 NOTE — Telephone Encounter (Signed)
No - I did not agree to this, specifically told pt to follow up with TJ or consider different provider, but not me as i do not feel comfortable prescribing his medication on regular scheduled basis - thanks

## 2011-11-25 NOTE — Telephone Encounter (Signed)
Informed patient, he will think about what he wants to do and call back

## 2012-01-20 ENCOUNTER — Other Ambulatory Visit: Payer: Self-pay | Admitting: Internal Medicine

## 2012-03-16 ENCOUNTER — Other Ambulatory Visit: Payer: Self-pay | Admitting: Internal Medicine

## 2012-03-17 ENCOUNTER — Other Ambulatory Visit: Payer: Self-pay | Admitting: Internal Medicine

## 2012-03-17 ENCOUNTER — Telehealth: Payer: Self-pay | Admitting: Internal Medicine

## 2012-03-17 NOTE — Telephone Encounter (Signed)
Patient is calling back to check the status of this medication, checked with his pharmacy and they have not recieved

## 2012-03-17 NOTE — Telephone Encounter (Signed)
ok 

## 2012-03-17 NOTE — Telephone Encounter (Signed)
The patient is hoping to get a refill of klonopin until his cpe on the 27th of this month.  His call back #[phone], thanks!

## 2012-03-18 NOTE — Telephone Encounter (Signed)
Rx placed upfront and pt notified to pick up

## 2012-04-02 ENCOUNTER — Encounter: Payer: Self-pay | Admitting: Internal Medicine

## 2012-04-02 ENCOUNTER — Ambulatory Visit (INDEPENDENT_AMBULATORY_CARE_PROVIDER_SITE_OTHER): Payer: BC Managed Care – PPO | Admitting: Internal Medicine

## 2012-04-02 VITALS — BP 136/78 | HR 92 | Temp 98.8°F | Resp 16 | Wt 255.0 lb

## 2012-04-02 DIAGNOSIS — D239 Other benign neoplasm of skin, unspecified: Secondary | ICD-10-CM

## 2012-04-02 DIAGNOSIS — F411 Generalized anxiety disorder: Secondary | ICD-10-CM

## 2012-04-02 MED ORDER — CLONAZEPAM 1 MG PO TABS: 1.0000 mg | ORAL_TABLET | Freq: Three times a day (TID) | ORAL | Status: DC | PRN

## 2012-04-02 MED ORDER — TRAZODONE HCL 50 MG PO TABS: 25.0000 mg | ORAL_TABLET | Freq: Every day | ORAL | Status: DC

## 2012-04-02 NOTE — Assessment & Plan Note (Signed)
Will continue klonopin as needed I have asked him to be more compliant with sertraline He will start trazodone at bedtime

## 2012-04-02 NOTE — Patient Instructions (Signed)

## 2012-04-02 NOTE — Progress Notes (Signed)
  Subjective:    Patient ID: Robert Werner, male    DOB: [birth year], 27 y.o.   MRN: [id number]  HPI  He returns for f/up and he tells me that the anxiety and panic are somewhat better but he complains of insomnia (DFA) today.  Review of Systems  Constitutional: Positive for unexpected weight change (weight gain). Negative for fever, chills, diaphoresis, activity change, appetite change and fatigue.  HENT: Negative.   Eyes: Negative.   Respiratory: Negative.  Negative for cough, chest tightness, shortness of breath and stridor.   Cardiovascular: Negative.  Negative for chest pain and palpitations.  Gastrointestinal: Negative.  Negative for abdominal pain.  Endocrine: Negative.   Genitourinary: Negative.   Musculoskeletal: Negative.   Skin: Positive for color change (changing mole on left side of his neck). Negative for pallor, rash and wound.  Allergic/Immunologic: Negative.   Neurological: Negative.   Hematological: Negative for adenopathy. Does not bruise/bleed easily.  Psychiatric/Behavioral: Positive for sleep disturbance. Negative for suicidal ideas, hallucinations, behavioral problems, confusion, self-injury, dysphoric mood, decreased concentration and agitation. The patient is nervous/anxious. The patient is not hyperactive.        Objective:   Physical Exam  Vitals reviewed. Constitutional: He is oriented to person, place, and time. He appears well-developed and well-nourished. No distress.  HENT:  Head: Normocephalic and atraumatic.  Mouth/Throat: Oropharynx is clear and moist. No oropharyngeal exudate.  Eyes: Conjunctivae are normal. Right eye exhibits no discharge. Left eye exhibits no discharge. No scleral icterus.  Neck: Normal range of motion. Neck supple. No JVD present. No tracheal deviation present. No thyromegaly present.  Cardiovascular: Normal rate, regular rhythm, normal heart sounds and intact distal pulses.  Exam reveals no gallop and no friction rub.   No murmur  heard. Pulmonary/Chest: Effort normal and breath sounds normal. No stridor. No respiratory distress. He has no wheezes. He has no rales. He exhibits no tenderness.  Abdominal: Soft. Bowel sounds are normal. He exhibits no distension and no mass. There is no tenderness. There is no rebound and no guarding.  Musculoskeletal: Normal range of motion. He exhibits no edema and no tenderness.  Lymphadenopathy:    He has no cervical adenopathy.  Neurological: He is oriented to person, place, and time.  Skin: Skin is warm and dry. No rash noted. He is not diaphoretic. No erythema. No pallor.     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 His mood appears anxious. His affect is not angry, not blunt, not labile and not inappropriate. His speech is not rapid and/or pressured, not delayed, not tangential and not slurred. He is not agitated, not aggressive, not hyperactive, not withdrawn and not combative. Cognition and memory are normal. He does not exhibit a depressed mood. He is communicative. He is attentive.          Assessment & Plan:

## 2012-04-02 NOTE — Assessment & Plan Note (Signed)
He wants this removed -----> derm referral

## 2012-07-22 ENCOUNTER — Ambulatory Visit (INDEPENDENT_AMBULATORY_CARE_PROVIDER_SITE_OTHER): Payer: Managed Care, Other (non HMO) | Admitting: Internal Medicine

## 2012-07-22 ENCOUNTER — Encounter: Payer: Self-pay | Admitting: Internal Medicine

## 2012-07-22 VITALS — BP 142/92 | HR 67 | Temp 97.9°F | Ht 72.0 in | Wt 252.5 lb

## 2012-07-22 DIAGNOSIS — F411 Generalized anxiety disorder: Secondary | ICD-10-CM

## 2012-07-22 DIAGNOSIS — J209 Acute bronchitis, unspecified: Secondary | ICD-10-CM

## 2012-07-22 DIAGNOSIS — J309 Allergic rhinitis, unspecified: Secondary | ICD-10-CM

## 2012-07-22 MED ORDER — FLUTICASONE PROPIONATE 50 MCG/ACT NA SUSP: 2.0000 | Freq: Every day | NASAL | Status: DC

## 2012-07-22 MED ORDER — METHYLPREDNISOLONE ACETATE 80 MG/ML IJ SUSP
80.0000 mg | Freq: Once | INTRAMUSCULAR | Status: AC
  Administered 2012-07-22: 80 mg via INTRAMUSCULAR

## 2012-07-22 MED ORDER — LEVOFLOXACIN 250 MG PO TABS: 250.0000 mg | ORAL_TABLET | Freq: Every day | ORAL | Status: DC

## 2012-07-22 MED ORDER — HYDROCODONE-HOMATROPINE 5-1.5 MG/5ML PO SYRP: 5.0000 mL | ORAL_SOLUTION | Freq: Four times a day (QID) | ORAL | Status: DC | PRN

## 2012-07-22 NOTE — Assessment & Plan Note (Signed)
Mild to mod, for flonase asd,  to f/u any worsening symptoms or concerns  

## 2012-07-22 NOTE — Patient Instructions (Signed)
You had the steroid shot today Please take all new medication as prescribed - antibiotic, cough medicine, and flonase for allergies Please continue all other medications as before, including the zyrtec Please have the pharmacy call with any other refills you may need.

## 2012-07-22 NOTE — Assessment & Plan Note (Signed)
stable overall by history and exam, and pt to continue medical treatment as before,  to f/u any worsening symptoms or concerns 

## 2012-07-22 NOTE — Assessment & Plan Note (Signed)
Mild to mod, for antibx course,  to f/u any worsening symptoms or concerns 

## 2012-07-22 NOTE — Progress Notes (Signed)
  Subjective:    Patient ID: Robert Werner, male    DOB: [birth year], 27 y.o.   MRN: [id number]  HPI   Here with 2-3 days acute onset fever, facial pain, pressure, headache, general weakness and malaise, and greenish d/c, with mild ST and prod cough, but pt denies chest pain, wheezing, increased sob or doe, orthopnea, PND, increased LE swelling, palpitations, dizziness or syncope.  Does have several wks ongoing nasal allergy symptoms with clearish congestion, itch and sneezing, without fever, pain, ST, cough, swelling or wheezing.  Denies worsening depressive symptoms, suicidal ideation, or panic; has ongoing anxiety, not increased recently.   Past Medical History  Diagnosis Date  . Anxiety    No past surgical history on file.  reports that he has never smoked. He does not have any smokeless tobacco history on file. He reports that he drinks about 3.0 ounces of alcohol per week. He reports that he does not use illicit drugs. family history includes Alcohol abuse in his other; Arthritis in his other; Drug abuse in his other; Hyperlipidemia in his other; Hypertension in his other; and Stroke in his other. No Known Allergies Current Outpatient Prescriptions on File Prior to Visit  Medication Sig Dispense Refill  . clonazePAM (KLONOPIN) 1 MG tablet Take 1 tablet (1 mg total) by mouth 3 (three) times daily as needed for anxiety.  90 tablet  4  . sertraline (ZOLOFT) 50 MG tablet Take 1 tablet (50 mg total) by mouth daily.  90 tablet  3  . traZODone (DESYREL) 50 MG tablet Take 0.5-1 tablets (25-50 mg total) by mouth at bedtime.  90 tablet  3   No current facility-administered medications on file prior to visit.   Review of Systems  Constitutional: Negative for unexpected weight change, or unusual diaphoresis  HENT: Negative for tinnitus.   Eyes: Negative for photophobia and visual disturbance.  Respiratory: Negative for choking and stridor.   Gastrointestinal: Negative for vomiting and blood in stool.   Genitourinary: Negative for hematuria and decreased urine volume.  Musculoskeletal: Negative for acute joint swelling Skin: Negative for color change and wound.  Neurological: Negative for tremors and numbness other than noted  Psychiatric/Behavioral: Negative for decreased concentration or  hyperactivity.       Objective:   Physical Exam BP 142/92  Pulse 67  Temp(Src) 97.9 F (36.6 C) (Oral)  Ht 6' (1.829 m)  Wt 252 lb 8 oz (114.533 kg)  BMI 34.24 kg/m2  SpO2 98% VS noted, mild ill Constitutional: Pt appears well-developed and well-nourished.  HENT: Head: NCAT.  Right Ear: External ear normal.  Left Ear: External ear normal.  Bilat tm's with mild erythema.  Max sinus areas mild tender.  Pharynx with mild erythema, no exudate Eyes: Conjunctivae and EOM are normal. Pupils are equal, round, and reactive to light.  Neck: Normal range of motion. Neck supple.  Cardiovascular: Normal rate and regular rhythm.   Pulmonary/Chest: Effort normal and breath sounds normal.  - no rales or wheezing Neurological: Pt is alert. Not confused  Skin: Skin is warm. No erythema.  Psychiatric: Pt behavior is normal. Thought content normal. 1+ nervous, not depressed affect    Assessment & Plan:

## 2012-08-26 ENCOUNTER — Ambulatory Visit: Payer: Managed Care, Other (non HMO) | Admitting: Internal Medicine

## 2012-09-03 ENCOUNTER — Encounter: Payer: Self-pay | Admitting: Internal Medicine

## 2012-09-03 ENCOUNTER — Ambulatory Visit (INDEPENDENT_AMBULATORY_CARE_PROVIDER_SITE_OTHER): Payer: Managed Care, Other (non HMO) | Admitting: Internal Medicine

## 2012-09-03 VITALS — BP 138/80 | HR 78 | Temp 98.1°F | Resp 16 | Wt 250.0 lb

## 2012-09-03 DIAGNOSIS — E66811 Obesity, class 1: Secondary | ICD-10-CM | POA: Insufficient documentation

## 2012-09-03 DIAGNOSIS — F411 Generalized anxiety disorder: Secondary | ICD-10-CM

## 2012-09-03 DIAGNOSIS — E669 Obesity, unspecified: Secondary | ICD-10-CM

## 2012-09-03 MED ORDER — SERTRALINE HCL 50 MG PO TABS: 50.0000 mg | ORAL_TABLET | Freq: Every day | ORAL | Status: DC

## 2012-09-03 MED ORDER — CLONAZEPAM 1 MG PO TABS: 1.0000 mg | ORAL_TABLET | Freq: Three times a day (TID) | ORAL | Status: DC | PRN

## 2012-09-03 NOTE — Assessment & Plan Note (Signed)
He is working on his lifestyle modifications to lose weight 

## 2012-09-03 NOTE — Patient Instructions (Signed)

## 2012-09-03 NOTE — Assessment & Plan Note (Signed)
He will continue his current meds. 

## 2012-09-03 NOTE — Progress Notes (Signed)
  Subjective:    Patient ID: Robert Werner, male    DOB: 01-14-1986, 27 y.o.   MRN: [id number]  HPI  He returns for f/up, his anxiety and insomnia are better. He feels like the current meds are working well.  Review of Systems  Constitutional: Negative.  Negative for fever, chills, diaphoresis and fatigue.  HENT: Negative.   Eyes: Negative.   Respiratory: Negative.  Negative for apnea, cough, shortness of breath, wheezing and stridor.   Cardiovascular: Negative.  Negative for chest pain, palpitations and leg swelling.  Gastrointestinal: Negative.   Endocrine: Negative.   Genitourinary: Negative.   Musculoskeletal: Negative.   Skin: Negative.   Allergic/Immunologic: Negative.   Neurological: Negative.   Hematological: Negative.   Psychiatric/Behavioral: Positive for sleep disturbance. Negative for suicidal ideas, hallucinations, behavioral problems, confusion, self-injury, dysphoric mood, decreased concentration and agitation. The patient is nervous/anxious. The patient is not hyperactive.        Objective:   Physical Exam  Vitals reviewed. Constitutional: He is oriented to person, place, and time. He appears well-developed and well-nourished. No distress.  HENT:  Head: Normocephalic and atraumatic.  Mouth/Throat: Oropharynx is clear and moist. No oropharyngeal exudate.  Eyes: Conjunctivae are normal. Right eye exhibits no discharge. Left eye exhibits no discharge. No scleral icterus.  Neck: Normal range of motion. Neck supple. No JVD present. No tracheal deviation present. No thyromegaly present.  Cardiovascular: Normal rate, regular rhythm, normal heart sounds and intact distal pulses.  Exam reveals no gallop and no friction rub.   No murmur heard. Pulmonary/Chest: Effort normal and breath sounds normal. No stridor. No respiratory distress. He has no wheezes. He has no rales. He exhibits no tenderness.  Abdominal: Soft. Bowel sounds are normal. He exhibits no distension and no mass.  There is no tenderness. There is no rebound and no guarding.  Musculoskeletal: Normal range of motion. He exhibits no edema and no tenderness.  Lymphadenopathy:    He has no cervical adenopathy.  Neurological: He is oriented to person, place, and time.  Skin: Skin is warm and dry. No rash noted. He is not diaphoretic. No erythema. No pallor.  Psychiatric: His behavior is normal. Judgment and thought content normal. His mood appears anxious. His affect is not angry, not blunt, not labile and not inappropriate. His speech is not rapid and/or pressured, not delayed, not tangential and not slurred. He is not agitated, not aggressive and not slowed. Thought content is not paranoid. Cognition and memory are normal. He does not exhibit a depressed mood. He expresses no suicidal ideation. He expresses no suicidal plans. He is communicative. He is attentive.     Lab Results  Component Value Date   WBC 5.9 04/01/2011   HGB 16.9 04/01/2011   HCT 50.6 04/01/2011   PLT 177.0 04/01/2011   GLUCOSE 101* 04/01/2011   CHOL 156 04/01/2011   TRIG 95.0 04/01/2011   HDL 56.50 04/01/2011   LDLCALC 81 04/01/2011   ALT 14 04/01/2011   AST 22 04/01/2011   NA 143 04/01/2011   K 4.4 04/01/2011   CL 110 04/01/2011   CREATININE 1.3 04/01/2011   BUN 11 04/01/2011   CO2 29 04/01/2011   TSH 1.57 03/30/2010       Assessment & Plan:

## 2012-09-10 ENCOUNTER — Other Ambulatory Visit: Payer: Managed Care, Other (non HMO)

## 2012-09-10 ENCOUNTER — Ambulatory Visit (INDEPENDENT_AMBULATORY_CARE_PROVIDER_SITE_OTHER): Payer: Managed Care, Other (non HMO) | Admitting: Internal Medicine

## 2012-09-10 ENCOUNTER — Encounter: Payer: Self-pay | Admitting: Internal Medicine

## 2012-09-10 ENCOUNTER — Other Ambulatory Visit: Payer: Self-pay | Admitting: Internal Medicine

## 2012-09-10 VITALS — BP 132/92 | HR 92 | Temp 97.6°F | Wt 252.1 lb

## 2012-09-10 DIAGNOSIS — J029 Acute pharyngitis, unspecified: Secondary | ICD-10-CM

## 2012-09-10 DIAGNOSIS — J069 Acute upper respiratory infection, unspecified: Secondary | ICD-10-CM

## 2012-09-10 LAB — POCT RAPID STREP A (OFFICE): Rapid Strep A Screen: NEGATIVE

## 2012-09-10 MED ORDER — HYDROCODONE-HOMATROPINE 5-1.5 MG/5ML PO SYRP: 5.0000 mL | ORAL_SOLUTION | Freq: Three times a day (TID) | ORAL | Status: DC | PRN

## 2012-09-10 MED ORDER — AZITHROMYCIN 250 MG PO TABS: ORAL_TABLET | ORAL | Status: DC

## 2012-09-10 MED ORDER — METHYLPREDNISOLONE ACETATE 80 MG/ML IJ SUSP
80.0000 mg | Freq: Once | INTRAMUSCULAR | Status: AC
  Administered 2012-09-10: 80 mg via INTRAMUSCULAR

## 2012-09-10 NOTE — Progress Notes (Signed)
HPI  Pt presents to the clinic today cough, nasal congestion and sore throat for the past 3 days. He has taken OTC Sudafed and Mucinex but it does not seem to help.  The cough is unproductive. He denies fever, chills or body aches. He has no history of allergies or asthma. He has not had sick contacts. He was treated for acute bronchitis 2 months ago. He does seem really anxious today. He is about to leave for [place] for 6 weeks. He does not want to be sick while he is gone.   Review of Systems    Past Medical History  Diagnosis Date  . Anxiety     Family History  Problem Relation Age of Onset  . Alcohol abuse Other   . Arthritis Other   . Drug abuse Other   . Hyperlipidemia Other   . Hypertension Other   . Stroke Other     History   Social History  . Marital Status: Single    Spouse Name: N/A    Number of Children: N/A  . Years of Education: N/A   Occupational History  . Not on file.   Social History Main Topics  . Smoking status: Never Smoker   . Smokeless tobacco: Never Used  . Alcohol Use: 3.0 oz/week    5 Glasses of wine, 0 Cans of beer per week  . Drug Use: No  . Sexually Active: Yes    Birth Control/ Protection: Condom   Other Topics Concern  . Not on file   Social History Narrative  . No narrative on file    No Known Allergies   Constitutional: Positive headache, fatigue. Denies fever or abrupt weight changes.  HEENT:  Positive nasal congestion and sore throat. Denies eye redness, ear pain, ringing in the ears, wax buildup, runny nose or bloody nose. Respiratory: Positive cough. Denies difficulty breathing or shortness of breath.  Cardiovascular: Denies chest pain, chest tightness, palpitations or swelling in the hands or feet.   No other specific complaints in a complete review of systems (except as listed in HPI above).  Objective:    BP 132/92  Pulse 92  Temp(Src) 97.6 F (36.4 C) (Oral)  Wt 252 lb 1.9 oz (114.361 kg)  BMI 34.19 kg/m2   SpO2 97% Wt Readings from Last 3 Encounters:  09/10/12 252 lb 1.9 oz (114.361 kg)  09/03/12 250 lb (113.399 kg)  07/22/12 252 lb 8 oz (114.533 kg)    General: Appears his stated age, well developed, well nourished in NAD. HEENT: Head: normal shape and size; Eyes: sclera white, no icterus, conjunctiva pink, PERRLA and EOMs intact; Ears: Tm's gray and intact, normal light reflex; Nose: mucosa pink and moist, septum midline; Throat/Mouth: + PND. Teeth present, mucosa erythematous and moist, no exudate noted, no lesions or ulcerations noted.  Neck: Mild cervical lymphadenopathy. Neck supple, trachea midline. No massses, lumps or thyromegaly present.  Cardiovascular: Normal rate and rhythm. S1,S2 noted.  No murmur, rubs or gallops noted. No JVD or BLE edema. No carotid bruits noted. Pulmonary/Chest: Normal effort and positive vesicular breath sounds. No respiratory distress. No wheezes, rales or ronchi noted.      Assessment & Plan:   Upper Respiratory Infection with pharyngitis, new onset:  Ibuprofen as needed for pain Will doe RST and throat culture eRx for azithromax x 5 days eRx for Hycodan cough syrup 80 mg Depo IM today  RTC as needed or if symptoms persist.

## 2012-09-10 NOTE — Addendum Note (Signed)
Addended by: Deatra James on: 09/10/2012 11:38 AM   Modules accepted: Orders

## 2012-09-10 NOTE — Patient Instructions (Signed)

## 2012-10-19 ENCOUNTER — Ambulatory Visit (INDEPENDENT_AMBULATORY_CARE_PROVIDER_SITE_OTHER): Payer: Managed Care, Other (non HMO) | Admitting: Internal Medicine

## 2012-10-19 ENCOUNTER — Encounter: Payer: Self-pay | Admitting: Internal Medicine

## 2012-10-19 VITALS — BP 128/84 | HR 82 | Temp 98.6°F | Resp 16 | Wt 237.0 lb

## 2012-10-19 DIAGNOSIS — J069 Acute upper respiratory infection, unspecified: Secondary | ICD-10-CM

## 2012-10-19 DIAGNOSIS — L259 Unspecified contact dermatitis, unspecified cause: Secondary | ICD-10-CM

## 2012-10-19 DIAGNOSIS — J029 Acute pharyngitis, unspecified: Secondary | ICD-10-CM

## 2012-10-19 DIAGNOSIS — J209 Acute bronchitis, unspecified: Secondary | ICD-10-CM

## 2012-10-19 DIAGNOSIS — L309 Dermatitis, unspecified: Secondary | ICD-10-CM | POA: Insufficient documentation

## 2012-10-19 DIAGNOSIS — R05 Cough: Secondary | ICD-10-CM

## 2012-10-19 DIAGNOSIS — R059 Cough, unspecified: Secondary | ICD-10-CM

## 2012-10-19 MED ORDER — TRIAMCINOLONE ACETONIDE 0.5 % EX CREA: TOPICAL_CREAM | Freq: Three times a day (TID) | CUTANEOUS | Status: DC

## 2012-10-19 MED ORDER — CEFUROXIME AXETIL 500 MG PO TABS: 500.0000 mg | ORAL_TABLET | Freq: Two times a day (BID) | ORAL | Status: DC

## 2012-10-19 NOTE — Progress Notes (Signed)
  Subjective:    Patient ID: Robert Werner, male    DOB: May 30, 1985, 27 y.o.   MRN: [id number]  Cough This is a recurrent problem. The current episode started more than 1 month ago. The problem has been unchanged. The problem occurs every few hours. The cough is productive of purulent sputum. Associated symptoms include a rash (itching, flaking, peeling on both hands). Pertinent negatives include no chest pain, chills, ear congestion, ear pain, fever, headaches, heartburn, hemoptysis, myalgias, nasal congestion, postnasal drip, rhinorrhea, sore throat, shortness of breath, sweats, weight loss or wheezing. Nothing aggravates the symptoms. He has tried a beta-agonist inhaler, prescription cough suppressant, steroid inhaler and oral steroids for the symptoms. The treatment provided mild relief.      Review of Systems  Constitutional: Negative.  Negative for fever, chills, weight loss, diaphoresis, appetite change and fatigue.  HENT: Negative.  Negative for ear pain, sore throat, rhinorrhea and postnasal drip.   Eyes: Negative.   Respiratory: Positive for cough. Negative for apnea, hemoptysis, choking, chest tightness, shortness of breath, wheezing and stridor.   Cardiovascular: Negative.  Negative for chest pain, palpitations and leg swelling.  Gastrointestinal: Negative.  Negative for heartburn, nausea, vomiting, abdominal pain, diarrhea, constipation and blood in stool.  Endocrine: Negative.   Genitourinary: Negative.   Musculoskeletal: Negative.  Negative for myalgias.  Skin: Positive for rash (itching, flaking, peeling on both hands). Negative for color change, pallor and wound.  Allergic/Immunologic: Negative.   Neurological: Negative.  Negative for headaches.  Hematological: Negative.  Negative for adenopathy. Does not bruise/bleed easily.  Psychiatric/Behavioral: Negative.        Objective:   Physical Exam  Vitals reviewed. Constitutional: He is oriented to person, place, and time. He  appears well-developed and well-nourished.  Non-toxic appearance. He does not have a sickly appearance. He does not appear ill. No distress.  HENT:  Head: Normocephalic and atraumatic.  Mouth/Throat: Oropharynx is clear and moist. No oropharyngeal exudate.  Eyes: Conjunctivae are normal. Right eye exhibits no discharge. Left eye exhibits no discharge. No scleral icterus.  Neck: Normal range of motion. Neck supple. No JVD present. No tracheal deviation present. No thyromegaly present.  Cardiovascular: Normal rate, regular rhythm, normal heart sounds and intact distal pulses.  Exam reveals no gallop and no friction rub.   No murmur heard. Pulmonary/Chest: Effort normal and breath sounds normal. No stridor. No respiratory distress. He has no wheezes. He has no rales. He exhibits no tenderness.  Abdominal: Soft. Bowel sounds are normal. He exhibits no distension and no mass. There is no tenderness. There is no rebound and no guarding.  Musculoskeletal: Normal range of motion. He exhibits no edema and no tenderness.  Lymphadenopathy:    He has no cervical adenopathy.  Neurological: He is oriented to person, place, and time.  Skin: Skin is warm and dry. No rash noted. He is not diaphoretic. No erythema. No pallor.  Psychiatric: He has a normal mood and affect. His behavior is normal. Judgment and thought content normal.          Assessment & Plan:

## 2012-10-19 NOTE — Assessment & Plan Note (Signed)
He took a zpak a few weeks ago and that helped some but his cough is still productive so infection is a concern, I will treat with ceftin this time

## 2012-10-19 NOTE — Patient Instructions (Signed)

## 2012-10-19 NOTE — Assessment & Plan Note (Addendum)
He has had multiple prescriptions for hycodan over the last few months so I am concerned that he has a problem with this and I have asked him to stop taking it He will see pulm medicine for further evaluation, possibly PFT's

## 2012-10-19 NOTE — Assessment & Plan Note (Signed)
I have asked him to stop the hydrocodone and to start using TAC cream BID

## 2012-10-30 ENCOUNTER — Encounter: Payer: Self-pay | Admitting: Internal Medicine

## 2012-10-30 ENCOUNTER — Ambulatory Visit (INDEPENDENT_AMBULATORY_CARE_PROVIDER_SITE_OTHER): Payer: Managed Care, Other (non HMO) | Admitting: Internal Medicine

## 2012-10-30 ENCOUNTER — Encounter: Payer: Self-pay | Admitting: *Deleted

## 2012-10-30 VITALS — BP 142/90 | HR 62 | Temp 98.1°F | Ht 72.0 in | Wt 245.0 lb

## 2012-10-30 DIAGNOSIS — R05 Cough: Secondary | ICD-10-CM

## 2012-10-30 MED ORDER — TRAMADOL HCL 50 MG PO TABS: ORAL_TABLET | ORAL | Status: DC

## 2012-10-30 MED ORDER — PANTOPRAZOLE SODIUM 40 MG PO TBEC: 40.0000 mg | DELAYED_RELEASE_TABLET | Freq: Every day | ORAL | Status: DC

## 2012-10-30 MED ORDER — PREDNISONE (PAK) 10 MG PO TABS: ORAL_TABLET | ORAL | Status: DC

## 2012-10-30 MED ORDER — FAMOTIDINE 20 MG PO TABS: ORAL_TABLET | ORAL | Status: DC

## 2012-10-30 NOTE — Patient Instructions (Addendum)
Please see patient coordinator before you leave today  to schedule sinus ct   The key to effective treatment for your cough is eliminating the non-stop cycle of cough you're stuck in long enough to let your airway heal completely and then see if there is anything still making you cough once you stop the cough suppression, but this should take no more than 5 days to figure out  First take delsym two tsp every 12 hours and supplement if needed with  tramadol 50 mg up to 2 every 4 hours to suppress the urge to cough at all or even clear your throat. Swallowing water or using ice chips/non mint and menthol containing candies (such as lifesavers or sugarless jolly ranchers) are also effective.  You should rest your voice and avoid activities that you know make you cough.  Once you have eliminated the cough for 3 straight days try reducing the tramadol first,  then the delsym as tolerated.    Try prilosec 20mg   Take 30-60 min before first meal of the day and Pepcid 20 mg one bedtime until cough is completely gone for at least a week without the need for cough suppression  I think of reflux for chronic cough like I do oxygen for fire (doesn't cause the fire but once you get the oxygen suppressed it usually goes away regardless of the exact cause).  GERD (REFLUX)  is an extremely common cause of respiratory symptoms, many times with no significant heartburn at all.    It can be treated with medication, but also with lifestyle changes including avoidance of late meals, excessive alcohol, smoking cessation, and avoid fatty foods, chocolate, peppermint, colas, red wine, and acidic juices such as orange juice.  NO MINT OR MENTHOL PRODUCTS SO NO COUGH DROPS  USE SUGARLESS CANDY INSTEAD (jolley ranchers or Stover's)  NO OIL BASED VITAMINS - use powdered substitutes.    Prednisone 10 mg take  4 each am x 2 days,   2 each am x 2 days,  1 each am x 2 days and stop     Please schedule a follow up office visit  in 2 weeks, sooner if needed

## 2012-10-30 NOTE — Progress Notes (Signed)
  Subjective:    Patient ID: Robert Werner, male    DOB: 09-12-1985  MRN: [id number]  HPI  15 yowm never smoked some sinus issues sometimes in spring but in 2014 with the worst ever onset in mid April stuffy nose, pressure, nasty drainage then coughing no better with clariton with cough to point of vomit > UC dx sinus infection / bronchitis/ some better but never really completely free of cough so referred 10/30/12 by Dr Robert Werner to pulmonary clinic  10/30/2012 1st [name] office visit/ Robert Werner cc daily cough severe, but not vomitig, prod of min mucoid sputum at this point. Usually only sob with cough, rx proair worsened cough, feels levaquin worked the best.    No obvious day to day or daytime variabilty or assoc chronic cough or cp or chest tightness, subjective wheeze overt sinus or hb symptoms. No unusual exp hx or h/o childhood pna/ asthma or knowledge of premature birth.  Sleeping ok without nocturnal  or early am exacerbation  of respiratory  c/o's or need for noct saba. Also denies any obvious fluctuation of symptoms with weather or environmental changes or other aggravating or alleviating factors except as outlined above   Current Medications, Allergies, Complete Past Medical History, Past Surgical History, Family History, and Social History were reviewed in [employer] record.          Review of Systems  Constitutional: Negative for fever, chills, activity change, appetite change and unexpected weight change.  HENT: Positive for sore throat. Negative for congestion, rhinorrhea, sneezing, trouble swallowing, dental problem, voice change and postnasal drip.   Eyes: Negative for visual disturbance.  Respiratory: Positive for cough and shortness of breath. Negative for choking.   Cardiovascular: Positive for chest pain. Negative for leg swelling.  Gastrointestinal: Negative for nausea, vomiting and abdominal pain.  Genitourinary: Negative for difficulty  urinating.  Musculoskeletal: Negative for arthralgias.  Skin: Negative for rash.  Psychiatric/Behavioral: Negative for behavioral problems and confusion.       Objective:   Physical Exam  amb wm nad   Wt Readings from Last 3 Encounters:  10/30/12 245 lb (111.131 kg)  10/19/12 237 lb (107.502 kg)  09/10/12 252 lb 1.9 oz (114.361 kg)      HEENT: nl dentition, turbinates, and orophanx. Nl external ear canals without cough reflex   NECK :  without JVD/Nodes/TM/ nl carotid upstrokes bilaterally   LUNGS: no acc muscle use, clear to A and P bilaterally without cough on insp or exp maneuvers   CV:  RRR  no s3 or murmur or increase in P2, no edema   ABD:  soft and nontender with nl excursion in the supine position. No bruits or organomegaly, bowel sounds nl  MS:  warm without deformities, calf tenderness, cyanosis or clubbing  SKIN: warm and dry without lesions    NEURO:  alert, approp, no deficits     cxr 10/17/12 wnl       Assessment & Plan:

## 2012-10-31 NOTE — Assessment & Plan Note (Signed)
The most common causes of chronic cough in immunocompetent adults include the following: upper airway cough syndrome (UACS), previously referred to as postnasal drip syndrome (PNDS), which is caused by variety of rhinosinus conditions; (2) asthma; (3) GERD; (4) chronic bronchitis from cigarette smoking or other inhaled environmental irritants; (5) nonasthmatic eosinophilic bronchitis; and (6) bronchiectasis.   These conditions, singly or in combination, have accounted for up to 94% of the causes of chronic cough in prospective studies.   Other conditions have constituted no >6% of the causes in prospective studies These have included bronchogenic carcinoma, chronic interstitial pneumonia, sarcoidosis, left ventricular failure, ACEI-induced cough, and aspiration from a condition associated with pharyngeal dysfunction.    Chronic cough is often simultaneously caused by more than one condition. A single cause has been found from 38 to 82% of the time, multiple causes from 18 to 62%. Multiply caused cough has been the result of three diseases up to 42% of the time.        Classic Upper airway cough syndrome, so named because it's frequently impossible to sort out how much is  CR/sinusitis with freq throat clearing (which can be related to primary GERD)   vs  causing  secondary (" extra esophageal")  GERD from wide swings in gastric pressure that occur with throat clearing, often  promoting self use of mint and menthol lozenges that reduce the lower esophageal sphincter tone and exacerbate the problem further in a cyclical fashion.   These are the same pts (now being labeled as having "irritable larynx syndrome" by some cough centers) who not infrequently have a history of having failed to tolerate ace inhibitors,  dry powder inhalers or biphosphonates or report having atypical reflux symptoms that don't respond to standard doses of PPI , and are easily confused as having aecopd or asthma flares by even  experienced allergists/ pulmonologists.   For now no more abx pending sinus ct and rx for cyclical cough - See instructions for specific recommendations which were reviewed directly with the patient who was given a copy with highlighter outlining the key components.

## 2012-11-02 ENCOUNTER — Other Ambulatory Visit: Payer: Managed Care, Other (non HMO)

## 2012-11-09 ENCOUNTER — Ambulatory Visit (INDEPENDENT_AMBULATORY_CARE_PROVIDER_SITE_OTHER)
Admission: RE | Admit: 2012-11-09 | Discharge: 2012-11-09 | Disposition: A | Discharge status: Home / Self Care | Payer: Managed Care, Other (non HMO) | Source: Ambulatory Visit | Attending: Internal Medicine | Admitting: Internal Medicine

## 2012-11-09 ENCOUNTER — Encounter: Payer: Self-pay | Admitting: Internal Medicine

## 2012-11-09 DIAGNOSIS — R059 Cough, unspecified: Secondary | ICD-10-CM

## 2012-11-09 DIAGNOSIS — R05 Cough: Secondary | ICD-10-CM

## 2012-11-11 ENCOUNTER — Telehealth: Payer: Self-pay | Admitting: Internal Medicine

## 2012-11-11 NOTE — Telephone Encounter (Signed)
Sorry for the delay but this was wnl

## 2012-11-11 NOTE — Telephone Encounter (Signed)
Pt had CT results 11/09/12. Please advise MW thanks

## 2012-11-11 NOTE — Telephone Encounter (Signed)
Pt advised. Payeton Germani, CMA  

## 2012-11-12 ENCOUNTER — Institutional Professional Consult (permissible substitution): Payer: Managed Care, Other (non HMO) | Admitting: Emergency Medicine

## 2012-11-16 ENCOUNTER — Encounter: Payer: Self-pay | Admitting: Internal Medicine

## 2012-11-16 ENCOUNTER — Other Ambulatory Visit (INDEPENDENT_AMBULATORY_CARE_PROVIDER_SITE_OTHER): Payer: Managed Care, Other (non HMO)

## 2012-11-16 ENCOUNTER — Ambulatory Visit (INDEPENDENT_AMBULATORY_CARE_PROVIDER_SITE_OTHER): Payer: Managed Care, Other (non HMO) | Admitting: Internal Medicine

## 2012-11-16 VITALS — BP 142/88 | HR 65 | Temp 98.3°F | Ht 72.0 in | Wt 250.0 lb

## 2012-11-16 DIAGNOSIS — R05 Cough: Secondary | ICD-10-CM

## 2012-11-16 LAB — CBC WITH DIFFERENTIAL/PLATELET
Basophils Absolute: 0 10*3/uL (ref 0.0–0.1)
Eosinophils Relative: 2.1 % (ref 0.0–5.0)
Hemoglobin: 17.1 g/dL — ABNORMAL HIGH (ref 13.0–17.0)
Lymphocytes Relative: 23 % (ref 12.0–46.0)
Monocytes Relative: 10 % (ref 3.0–12.0)
Neutro Abs: 5.4 10*3/uL (ref 1.4–7.7)
Neutrophils Relative %: 64.5 % (ref 43.0–77.0)
Platelets: 201 10*3/uL (ref 150.0–400.0)
RBC: 5.56 Mil/uL (ref 4.22–5.81)
WBC: 8.3 10*3/uL (ref 4.5–10.5)

## 2012-11-16 MED ORDER — PREDNISONE (PAK) 10 MG PO TABS: ORAL_TABLET | ORAL | Status: DC

## 2012-11-16 MED ORDER — TRAMADOL HCL 50 MG PO TABS: ORAL_TABLET | ORAL | Status: DC

## 2012-11-16 NOTE — Progress Notes (Signed)
Subjective:    Patient ID: Robert Werner, male    DOB: 10-Mar-1985  MRN: [id number]    Brief patient profile:  27 yowm never smoked some sinus issues sometimes in spring but in 2014 with the worst ever onset in mid April stuffy nose, pressure, nasty drainage then coughing no better with clariton with cough to point of vomit > UC dx sinus infection / bronchitis/ some better but never really completely free of cough so referred 10/30/12 by Dr Leitha Schuller to pulmonary clinic   History of Present Illness  10/30/2012 1st  Pulmonary office visit/ Robert Werner cc daily cough severe, but not vomitig, prod of min mucoid sputum at this point. Usually only sob with cough, rx proair worsened cough, feels levaquin worked the best. rec   schedule sinus ct > neg First take delsym two tsp every 12 hours and supplement if needed with  tramadol 50 mg up to 2 every 4 hours to suppress the urge to cough at all or even clear your throat.   Try prilosec 20mg   Take 30-60 min before first meal of the day and Pepcid 20 mg one bedtime until cough is completely gone for at least a week without the need for cough suppression Prednisone 10 mg take  4 each am x 2 days,   2 each am x 2 days,  1 each am x 2 days and stop      11/16/2012 f/u ov/Robert Werner re: chronic cough Chief Complaint  Patient presents with  . Follow-up    Pt states that after last visit his cough seemed to have improved, but then worsened approx 6 days ago. He was seen at [facility] and was txed with 5 days of levaquin- cough some better now, non prod.     No obvious pattern in day to day or daytime variabilty or assoc sob  or cp or chest tightness, subjective wheeze overt sinus or hb symptoms. No unusual exp hx or h/o childhood pna/ asthma or knowledge of premature birth.    Also denies any obvious fluctuation of symptoms with weather or environmental changes or other aggravating or alleviating factors except as outlined above   Current Medications, Allergies, Complete  Past Medical History, Past Surgical History, Family History, and Social History were reviewed in [employer] record.  ROS  The following are not active complaints unless bolded sore throat, dysphagia, dental problems, itching, sneezing,  nasal congestion or excess/ purulent secretions, ear ache,   fever, chills, sweats, unintended wt loss, pleuritic or exertional cp, hemoptysis,  orthopnea pnd or leg swelling, presyncope, palpitations, heartburn, abdominal pain, anorexia, nausea, vomiting, diarrhea  or change in bowel or urinary habits, change in stools or urine, dysuria,hematuria,  rash, arthralgias, visual complaints, headache, numbness weakness or ataxia or problems with walking or coordination,  change in mood/affect or memory.                   Objective:   Physical Exam  amb wm nad with mint in mouth/ ? Flat affect  11/16/2012    250  Wt Readings from Last 3 Encounters:  10/30/12 245 lb (111.131 kg)  10/19/12 237 lb (107.502 kg)  09/10/12 252 lb 1.9 oz (114.361 kg)      HEENT: nl dentition, turbinates, and orophanx. Nl external ear canals without cough reflex   NECK :  without JVD/Nodes/TM/ nl carotid upstrokes bilaterally   LUNGS: no acc muscle use, clear to A and P bilaterally without cough on insp  or exp maneuvers   CV:  RRR  no s3 or murmur or increase in P2, no edema   ABD:  soft and nontender with nl excursion in the supine position. No bruits or organomegaly, bowel sounds nl  MS:  warm without deformities, calf tenderness, cyanosis or clubbing  SKIN: warm and dry without lesions    NEURO:  Alert,  no deficits     cxr 10/17/12 wnl       Assessment & Plan:

## 2012-11-16 NOTE — Patient Instructions (Addendum)
The key to effective treatment for your cough is eliminating the non-stop cycle of cough you're stuck in long enough to let your airway heal completely and then see if there is anything still making you cough once you stop the cough suppression, but this should take no more than 5 days to figure out  First take delsym two tsp every 12 hours and supplement if needed with  tramadol 50 mg up to 2 every 4 hours to suppress the urge to cough at all or even clear your throat. Swallowing water or using ice chips/non mint and menthol containing candies (such as lifesavers or sugarless jolly ranchers) are also effective.  You should rest your voice and avoid activities that you know make you cough.  Once you have eliminated the cough for 3 straight days try reducing the tramadol first,  then the delsym as tolerated.    Try prilosec 20mg   Take 30-60 min before first meal of the day and Pepcid 20 mg one bedtime and chlortrimeton 4mg  at bedtime until  Your return - also ok to use chlortrimeton during the day but may make you sleeping     GERD (REFLUX)  is an extremely common cause of respiratory symptoms, many times with no significant heartburn at all.    It can be treated with medication, but also with lifestyle changes including avoidance of late meals, excessive alcohol, smoking cessation, and avoid fatty foods, chocolate, peppermint, colas, red wine, and acidic juices such as orange juice.  NO MINT OR MENTHOL PRODUCTS SO NO COUGH DROPS  USE SUGARLESS CANDY INSTEAD (jolley ranchers or Stover's or Lifesavers)  NO OIL BASED VITAMINS - use powdered substitutes.   Prednisone 10 mg take  4 each am x 2 days,   2 each am x 2 days,  1 each am x 2 days and stop   Please remember to go to the lab  department downstairs for your tests - we will call you with the results when they are available.     Please schedule a follow up office visit in 2 weeks, sooner if needed with all medications in hand

## 2012-11-17 ENCOUNTER — Encounter: Payer: Self-pay | Admitting: Internal Medicine

## 2012-11-17 LAB — ALLERGY PROFILE REGION II-DC, DE, MD, ~~LOC~~, VA
Allergen, D pternoyssinus,d7: 5.98 kU/L — ABNORMAL HIGH
Alternaria Alternata: <0.1 kU/L
Aspergillus fumigatus, m3: <0.1 kU/L
Bermuda Grass: <0.1 kU/L
Box Elder IgE: <0.1 kU/L
Cockroach: <0.1 kU/L
Common Ragweed: <0.1 kU/L
D. farinae: 7.76 kU/L — ABNORMAL HIGH
Dog Dander: <0.1 kU/L
Johnson Grass: <0.1 kU/L
Oak: <0.1 kU/L
Pecan/Hickory Tree IgE: <0.1 kU/L

## 2012-11-17 NOTE — Assessment & Plan Note (Addendum)
-   Sinus CT 11/09/2012 > No evidence of sinusitis. - Allergy profile 11/16/12 > IgE 32.8   Strongly suspect  Classic Upper airway cough syndrome, so named because it's frequently impossible to sort out how much is  CR/sinusitis with freq throat clearing (which can be related to primary GERD)   vs  causing  secondary (" extra esophageal")  GERD from wide swings in gastric pressure that occur with throat clearing, often  promoting self use of mint and menthol lozenges that reduce the lower esophageal sphincter tone and exacerbate the problem further in a cyclical fashion.   These are the same pts (now being labeled as having "irritable larynx syndrome" by some cough centers) who not infrequently have a history of having failed to tolerate ace inhibitors,  dry powder inhalers or biphosphonates or report having atypical reflux symptoms that don't respond to standard doses of PPI , and are easily confused as having aecopd or asthma flares by even experienced allergists/ pulmonologists.   Discussed with pt  The standardized cough guidelines published in Chest by [place] in 2006 are still the best available and consist of a multiple step process (up to 12!) , not a single office visit,  and are intended  to address this problem logically,  with an alogrithm dependent on response to empiric treatment at  each progressive step  to determine a specific diagnosis with  minimal addtional testing needed. Therefore if adherence is an issue or can't be accurately verified,  it's very unlikely the standard evaluation and treatment will be successful here.    Furthermore, response to therapy (other than acute cough suppression, which should only be used short term with avoidance of narcotic containing cough syrups if possible), can be a gradual process for which the patient may perceive immediate benefit.  Unlike going to an eye doctor where the best perscription is almost always the first one and is immediately  effective, this is almost never the case in the management of chronic cough syndromes. Therefore the patient needs to commit up front to consistently adhere to recommendations  for up to 6 weeks of therapy directed at the likely underlying problem(s) before the response can be reasonably evaluated.   Need to redo the cyclical cough regimen plus add 1st gen H1 at hs  and get rid of mint then regroup with all meds in hand

## 2012-11-23 ENCOUNTER — Telehealth: Payer: Self-pay | Admitting: Internal Medicine

## 2012-11-23 NOTE — Telephone Encounter (Signed)
Notes Recorded by Nyoka Cowden, MD on 11/17/2012 at 3:27 PM Call patient : Study is c/w mild allergy to dust and grass but not enough to explain his symptoms so no changes needed for now will review details at f/u ov   I spoke with patient about results and he verbalized understanding and had no questions

## 2012-11-30 ENCOUNTER — Encounter: Payer: Managed Care, Other (non HMO) | Admitting: Adult Health

## 2012-12-04 ENCOUNTER — Encounter: Payer: Managed Care, Other (non HMO) | Admitting: Adult Health

## 2012-12-21 ENCOUNTER — Encounter: Payer: Managed Care, Other (non HMO) | Admitting: Adult Health

## 2012-12-24 ENCOUNTER — Telehealth: Payer: Self-pay | Admitting: Internal Medicine

## 2012-12-24 NOTE — Telephone Encounter (Signed)
Patient called stating he went to pharmacy to get his clonazePAM (KLONOPIN) 1 MG tablet And was told that he needs approval from doctor before he can pick prescription up  Please advise what needs to be done

## 2012-12-24 NOTE — Telephone Encounter (Signed)
This type of controlled medication is normally handled by the PCP - Dr Ronalee Belts to let pt know and forward if Dr Yetta Barre is in the office Nov 21

## 2012-12-25 NOTE — Telephone Encounter (Signed)
He has to be seen in person to refill an medication like this

## 2012-12-28 NOTE — Telephone Encounter (Signed)
Called and left VM for patient to give [place] a call to schedule an appointment

## 2013-01-25 ENCOUNTER — Ambulatory Visit (INDEPENDENT_AMBULATORY_CARE_PROVIDER_SITE_OTHER): Payer: Managed Care, Other (non HMO) | Admitting: Internal Medicine

## 2013-01-25 VITALS — BP 122/84 | HR 70 | Temp 97.8°F | Resp 16 | Ht 72.0 in | Wt 223.0 lb

## 2013-01-25 DIAGNOSIS — F411 Generalized anxiety disorder: Secondary | ICD-10-CM

## 2013-01-25 DIAGNOSIS — E669 Obesity, unspecified: Secondary | ICD-10-CM

## 2013-01-25 MED ORDER — CLONAZEPAM 1 MG PO TABS: 1.0000 mg | ORAL_TABLET | Freq: Three times a day (TID) | ORAL | Status: DC | PRN

## 2013-01-25 NOTE — Progress Notes (Signed)
Pre visit review using our clinic review tool, if applicable. No additional management support is needed unless otherwise documented below in the visit note. 

## 2013-01-25 NOTE — Patient Instructions (Signed)

## 2013-01-25 NOTE — Progress Notes (Signed)
   Subjective:    Patient ID: Robert Werner, male    DOB: March 24, 1985, 27 y.o.   MRN: [id number]  HPI  He returns for f/up and tells me that he is doing well, his anxiety level has diminished and he wants to start to taper the klonopin dosing. He has lost weight with diet an exercise as well.  Review of Systems  Constitutional: Negative.  Negative for fever, chills, diaphoresis, appetite change and fatigue.  HENT: Negative.   Eyes: Negative.   Respiratory: Negative.  Negative for cough, choking, chest tightness, shortness of breath, wheezing and stridor.   Cardiovascular: Negative.  Negative for chest pain, palpitations and leg swelling.  Gastrointestinal: Negative.  Negative for abdominal pain.  Endocrine: Negative.   Genitourinary: Negative.   Musculoskeletal: Negative.   Skin: Negative.   Allergic/Immunologic: Negative.   Neurological: Negative.   Hematological: Negative.  Negative for adenopathy. Does not bruise/bleed easily.  Psychiatric/Behavioral: Negative for suicidal ideas, hallucinations, behavioral problems, confusion, sleep disturbance, dysphoric mood, decreased concentration and agitation. The patient is nervous/anxious. The patient is not hyperactive.        Objective:   Physical Exam  Vitals reviewed. Constitutional: He is oriented to person, place, and time. He appears well-developed and well-nourished. No distress.  HENT:  Head: Normocephalic and atraumatic.  Mouth/Throat: Oropharynx is clear and moist. No oropharyngeal exudate.  Eyes: Conjunctivae are normal. Right eye exhibits no discharge. Left eye exhibits no discharge. No scleral icterus.  Neck: Normal range of motion. Neck supple. No JVD present. No tracheal deviation present. No thyromegaly present.  Cardiovascular: Normal rate, regular rhythm, normal heart sounds and intact distal pulses.  Exam reveals no gallop and no friction rub.   No murmur heard. Pulmonary/Chest: Effort normal and breath sounds normal. No  stridor. No respiratory distress. He has no wheezes. He has no rales. He exhibits no tenderness.  Abdominal: Soft. Bowel sounds are normal. He exhibits no distension and no mass. There is no tenderness. There is no rebound and no guarding.  Musculoskeletal: Normal range of motion. He exhibits no edema and no tenderness.  Lymphadenopathy:    He has no cervical adenopathy.  Neurological: He is oriented to person, place, and time.  Skin: Skin is warm and dry. No rash noted. He is not diaphoretic. No erythema. No pallor.  Psychiatric: He has a normal mood and affect. His speech is normal and behavior is normal. Judgment and thought content normal. His mood appears not anxious. His affect is not angry, not blunt, not labile and not inappropriate. Cognition and memory are normal. He does not exhibit a depressed mood.     Lab Results  Component Value Date   WBC 8.3 11/16/2012   HGB 17.1* 11/16/2012   HCT 50.5 11/16/2012   PLT 201.0 11/16/2012   GLUCOSE 101* 04/01/2011   CHOL 156 04/01/2011   TRIG 95.0 04/01/2011   HDL 56.50 04/01/2011   LDLCALC 81 04/01/2011   ALT 14 04/01/2011   AST 22 04/01/2011   NA 143 04/01/2011   K 4.4 04/01/2011   CL 110 04/01/2011   CREATININE 1.3 04/01/2011   BUN 11 04/01/2011   CO2 29 04/01/2011   TSH 1.57 03/30/2010       Assessment & Plan:

## 2013-01-26 ENCOUNTER — Encounter: Payer: Self-pay | Admitting: Internal Medicine

## 2013-01-26 NOTE — Assessment & Plan Note (Signed)
As requested, I wrote an Rx for a lower dose for him to taper and eventually discontinue the klonopin

## 2013-01-26 NOTE — Assessment & Plan Note (Signed)
He was congratulated for the weight loss

## 2013-04-16 ENCOUNTER — Telehealth: Payer: Self-pay | Admitting: *Deleted

## 2013-04-16 DIAGNOSIS — F411 Generalized anxiety disorder: Secondary | ICD-10-CM

## 2013-04-16 MED ORDER — CLONAZEPAM 1 MG PO TABS: 1.0000 mg | ORAL_TABLET | Freq: Three times a day (TID) | ORAL | Status: DC | PRN

## 2013-04-16 NOTE — Addendum Note (Signed)
Addended by: Etta GrandchildJONES, Cyriah Childrey L on: 04/16/2013 03:49 PM   Modules accepted: Orders

## 2013-04-16 NOTE — Telephone Encounter (Signed)
Patient phoned, has OV scheduled 05/17/13 and is going to be needing clonopin refilled within the next week.  Please advise.  CB# [phone]

## 2013-04-21 ENCOUNTER — Telehealth: Payer: Self-pay

## 2013-04-21 NOTE — Telephone Encounter (Signed)
I just did this 5 days ago 

## 2013-04-21 NOTE — Telephone Encounter (Signed)
The patient called and is hoping to get an early refill of his Stephania FragminKlonopin   Callback - [phone]

## 2013-04-21 NOTE — Telephone Encounter (Signed)
FYI..Patient notified and stated that pharmacy did received Rx but still would not fill. I called [facility] spoke with pharmacy and advised ok to refill early per MD. They wanted to inform MD that "patient always request too early", and supplied fill dates of 1/18, 2/7 and 2/28.

## 2013-04-26 ENCOUNTER — Telehealth: Payer: Self-pay | Admitting: Internal Medicine

## 2013-04-26 NOTE — Telephone Encounter (Signed)
Patient is calling frustrated because his Klonopin rx cannot be filled until 3/25 at the pharmacy. Patient states that he is out of the medication and wants to know if we can call to give them authorization to fill early. Advised pt that our office called his pharmacy on the 18th already. He says that there was no resolution. Please advise.

## 2013-04-26 NOTE — Telephone Encounter (Signed)
FYI.Marland Kitchen.Marland Kitchen.See previous phone note,patient called back stating that even though MD has approve, pharmacy still will not process Rx. I caled pharmacy back and spoke with rep and advised once again that MD approved early refill on 3/18 and was also forwarded dates for previous fills. She states that she still feels uncomfortable and pt has been going [facility] several [facility] trying [facility] get [facility]. She [facility] LMOVM advising pt of what pharmacist stated and at this point there is nothing further that our office can do.  Sandi MealyLakisha T Archie, CMA at 04/26/2013 9:22 AM     See previous phone note, Pharmacist advised ok to fill early by MD and pt was notified. Nothing further that our office can do, pt will have to resolve through pharmacy manager.

## 2013-04-26 NOTE — Telephone Encounter (Signed)
See previous phone note, Pharmacist advised ok to fill early by MD and pt was notified. Nothing  further that our office can do, pt will have to resolve through pharmacy manager.

## 2013-05-17 ENCOUNTER — Encounter: Payer: Managed Care, Other (non HMO) | Admitting: Internal Medicine

## 2013-06-07 ENCOUNTER — Encounter: Payer: Self-pay | Admitting: Internal Medicine

## 2013-06-07 ENCOUNTER — Ambulatory Visit (INDEPENDENT_AMBULATORY_CARE_PROVIDER_SITE_OTHER): Payer: Managed Care, Other (non HMO) | Admitting: Internal Medicine

## 2013-06-07 ENCOUNTER — Other Ambulatory Visit (INDEPENDENT_AMBULATORY_CARE_PROVIDER_SITE_OTHER): Payer: Managed Care, Other (non HMO)

## 2013-06-07 ENCOUNTER — Encounter: Payer: Self-pay | Admitting: *Deleted

## 2013-06-07 VITALS — BP 122/78 | HR 62 | Temp 98.4°F | Resp 18 | Wt 225.0 lb

## 2013-06-07 DIAGNOSIS — D751 Secondary polycythemia: Secondary | ICD-10-CM | POA: Insufficient documentation

## 2013-06-07 DIAGNOSIS — F411 Generalized anxiety disorder: Secondary | ICD-10-CM

## 2013-06-07 DIAGNOSIS — Z0000 Encounter for general adult medical examination without abnormal findings: Secondary | ICD-10-CM

## 2013-06-07 LAB — CBC WITH DIFFERENTIAL/PLATELET
Basophils Absolute: 0.1 10*3/uL (ref 0.0–0.1)
Basophils Relative: 0.7 % (ref 0.0–3.0)
EOS ABS: 0.6 10*3/uL (ref 0.0–0.7)
EOS PCT: 7.4 % — AB (ref 0.0–5.0)
HCT: 52.3 % — ABNORMAL HIGH (ref 39.0–52.0)
Hemoglobin: 17.5 g/dL — ABNORMAL HIGH (ref 13.0–17.0)
Lymphocytes Relative: 30.4 % (ref 12.0–46.0)
Lymphs Abs: 2.3 10*3/uL (ref 0.7–4.0)
MCHC: 33.4 g/dL (ref 30.0–36.0)
MCV: 90.3 fl (ref 78.0–100.0)
MONO ABS: 0.8 10*3/uL (ref 0.1–1.0)
Monocytes Relative: 10.6 % (ref 3.0–12.0)
NEUTROS ABS: 3.8 10*3/uL (ref 1.4–7.7)
NEUTROS PCT: 50.9 % (ref 43.0–77.0)
Platelets: 197 10*3/uL (ref 150.0–400.0)
RBC: 5.8 Mil/uL (ref 4.22–5.81)
RDW: 15.1 % — ABNORMAL HIGH (ref 11.5–14.6)
WBC: 7.5 10*3/uL (ref 4.5–10.5)

## 2013-06-07 LAB — COMPREHENSIVE METABOLIC PANEL
ALBUMIN: 4.4 g/dL (ref 3.5–5.2)
ALK PHOS: 58 U/L (ref 39–117)
ALT: 30 U/L (ref 0–53)
AST: 26 U/L (ref 0–37)
BUN: 14 mg/dL (ref 6–23)
CO2: 26 mEq/L (ref 19–32)
Calcium: 9.6 mg/dL (ref 8.4–10.5)
Chloride: 106 mEq/L (ref 96–112)
Creatinine, Ser: 1.5 mg/dL (ref 0.4–1.5)
GFR: 60.59 mL/min (ref >60.00)
Glucose, Bld: 76 mg/dL (ref 70–99)
POTASSIUM: 5 meq/L (ref 3.5–5.1)
Sodium: 140 mEq/L (ref 135–145)
Total Bilirubin: 1.1 mg/dL (ref 0.3–1.2)
Total Protein: 6.8 g/dL (ref 6.0–8.3)

## 2013-06-07 LAB — LIPID PANEL
CHOLESTEROL: 141 mg/dL (ref 0–200)
HDL: 52.9 mg/dL (ref >39.00)
LDL CALC: 72 mg/dL (ref 0–99)
Total CHOL/HDL Ratio: 3
Triglycerides: 80 mg/dL (ref 0.0–149.0)
VLDL: 16 mg/dL (ref 0.0–40.0)

## 2013-06-07 LAB — TSH: TSH: 1.39 u[IU]/mL (ref 0.35–5.50)

## 2013-06-07 MED ORDER — CLONAZEPAM 1 MG PO TABS: 1.0000 mg | ORAL_TABLET | Freq: Three times a day (TID) | ORAL | Status: DC | PRN

## 2013-06-07 MED ORDER — SERTRALINE HCL 50 MG PO TABS: 50.0000 mg | ORAL_TABLET | Freq: Every day | ORAL | Status: DC

## 2013-06-07 NOTE — Progress Notes (Signed)
Subjective:    Patient ID: Robert FosterJohn Werner, male    DOB: 08/06/1985, 28 y.o.   MRN: [id number]  HPI Comments: He returns for f/up and complains of persistent issues with anxiety, panic, insomnia. He does not take trazodone because it makes him feel groggy the next day. He does not think he needs zoloft. He has tried to stop taking klonopin but when he lowers the dose or the frequency he feels waves of anxiety and is unable to sleep.     Review of Systems  Constitutional: Negative for fever, chills, diaphoresis, appetite change and fatigue.  HENT: Negative.   Eyes: Negative.   Respiratory: Negative.  Negative for cough, choking, chest tightness, shortness of breath, wheezing and stridor.   Cardiovascular: Negative.  Negative for chest pain, palpitations and leg swelling.  Gastrointestinal: Negative.  Negative for nausea, vomiting, abdominal pain, diarrhea, constipation and blood in stool.  Endocrine: Negative.   Genitourinary: Negative.   Musculoskeletal: Negative.   Skin: Negative.  Negative for rash.  Allergic/Immunologic: Negative.   Neurological: Negative.  Negative for dizziness, tremors, weakness, light-headedness and headaches.  Hematological: Negative.  Negative for adenopathy. Does not bruise/bleed easily.  Psychiatric/Behavioral: Positive for sleep disturbance. Negative for suicidal ideas, hallucinations, behavioral problems, confusion, self-injury, dysphoric mood, decreased concentration and agitation. The patient is nervous/anxious. The patient is not hyperactive.        Objective:   Physical Exam  Vitals reviewed. Constitutional: He is oriented to person, place, and time. He appears well-developed and well-nourished. No distress.  HENT:  Head: Normocephalic and atraumatic.  Mouth/Throat: Oropharynx is clear and moist. No oropharyngeal exudate.  Eyes: Conjunctivae are normal. Right eye exhibits no discharge. Left eye exhibits no discharge. No scleral icterus.  Neck:  Normal range of motion. Neck supple. No JVD present. No tracheal deviation present. No thyromegaly present.  Cardiovascular: Normal rate, regular rhythm, normal heart sounds and intact distal pulses.  Exam reveals no gallop and no friction rub.   No murmur heard. Pulmonary/Chest: Effort normal and breath sounds normal. No stridor. No respiratory distress. He has no wheezes. He has no rales. He exhibits no tenderness.  Abdominal: Soft. Bowel sounds are normal. He exhibits no distension and no mass. There is no tenderness. There is no rebound and no guarding. Hernia confirmed negative in the right inguinal area and confirmed negative in the left inguinal area.  Genitourinary: Testes normal and penis normal. Right testis shows no mass, no swelling and no tenderness. Right testis is descended. Left testis shows no mass, no swelling and no tenderness. Left testis is descended. Circumcised. No penile erythema or penile tenderness. No discharge found.  Musculoskeletal: Normal range of motion. He exhibits no edema and no tenderness.  Lymphadenopathy:       Right: No inguinal adenopathy present.       Left: No inguinal adenopathy present.  Neurological: He is oriented to person, place, and time.  Skin: Skin is warm and dry. No rash noted. He is not diaphoretic. No erythema. No pallor.  Psychiatric: His behavior is normal. Judgment and thought content normal. His mood appears anxious. His affect is not angry, not labile and not inappropriate. His speech is not rapid and/or pressured, not delayed, not tangential and not slurred. Cognition and memory are normal. He does not exhibit a depressed mood. He expresses no homicidal and no suicidal ideation. He expresses no suicidal plans and no homicidal plans. He is communicative.     Lab Results  Component Value Date  WBC 8.3 11/16/2012   HGB 17.1* 11/16/2012   HCT 50.5 11/16/2012   PLT 201.0 11/16/2012   GLUCOSE 101* 04/01/2011   CHOL 156 04/01/2011   TRIG  95.0 04/01/2011   HDL 56.50 04/01/2011   LDLCALC 81 04/01/2011   ALT 14 04/01/2011   AST 22 04/01/2011   NA 143 04/01/2011   K 4.4 04/01/2011   CL 110 04/01/2011   CREATININE 1.3 04/01/2011   BUN 11 04/01/2011   CO2 29 04/01/2011   TSH 1.57 03/30/2010       Assessment & Plan:

## 2013-06-07 NOTE — Patient Instructions (Signed)
Health Maintenance, Males A healthy lifestyle and preventative care can promote health and wellness.  Maintain regular health, dental, and eye exams.  Eat a healthy diet. Foods like vegetables, fruits, whole grains, low-fat dairy products, and lean protein foods contain the nutrients you need and are low in calories. Decrease your intake of foods high in solid fats, added sugars, and salt. Get information about a proper diet from your health care provider, if necessary.  Regular physical exercise is one of the most important things you can do for your health. Most adults should get at least 150 minutes of moderate-intensity exercise (any activity that increases your heart rate and causes you to sweat) each week. In addition, most adults need muscle-strengthening exercises on 2 or more days a week.   Maintain a healthy weight. The body mass index (BMI) is a screening tool to identify possible weight problems. It provides an estimate of body fat based on height and weight. Your health care provider can find your BMI and can help you achieve or maintain a healthy weight. For males 20 years and older:  A BMI below 18.5 is considered underweight.  A BMI of 18.5 to 24.9 is normal.  A BMI of 25 to 29.9 is considered overweight.  A BMI of 30 and above is considered obese.  Maintain normal blood lipids and cholesterol by exercising and minimizing your intake of saturated fat. Eat a balanced diet with plenty of fruits and vegetables. Blood tests for lipids and cholesterol should begin at age 20 and be repeated every 5 years. If your lipid or cholesterol levels are high, you are over 50, or you are at high risk for heart disease, you may need your cholesterol levels checked more frequently.Ongoing high lipid and cholesterol levels should be treated with medicines, if diet and exercise are not working.  If you smoke, find out from your health care provider how to quit. If you do not use tobacco, do not  start.  Lung cancer screening is recommended for adults aged 55 80 years who are at high risk for developing lung cancer because of a history of smoking. A yearly low-dose CT scan of the lungs is recommended for people who have at least a 30-pack-year history of smoking and are a current smoker or have quit within the past 15 years. A pack year of smoking is smoking an average of 1 pack of cigarettes a day for 1 year (for example, a 30-pack-year history of smoking could mean smoking 1 pack a day for 30 years or 2 packs a day for 15 years). Yearly screening should continue until the smoker has stopped smoking for at least 15 years. Yearly screening should be stopped for people who develop a health problem that would prevent them from having lung cancer treatment.  If you choose to drink alcohol, do not have more than 2 drinks per day. One drink is considered to be 12 oz (360 mL) of beer, 5 oz (150 mL) of wine, or 1.5 oz (45 mL) of liquor.  Avoid use of street drugs. Do not share needles with anyone. Ask for help if you need support or instructions about stopping the use of drugs.  High blood pressure causes heart disease and increases the risk of stroke. Blood pressure should be checked at least every 1 2 years. Ongoing high blood pressure should be treated with medicines if weight loss and exercise are not effective.  If you are 45 28 years old, ask your health   care provider if you should take aspirin to prevent heart disease.  Diabetes screening involves taking a blood sample to check your fasting blood sugar level. This should be done once every 3 years after age 45, if you are at a normal weight and without risk factors for diabetes. Testing should be considered at a younger age or be carried out more frequently if you are overweight and have at least 1 risk factor for diabetes.  Colorectal cancer can be detected and often prevented. Most routine colorectal cancer screening begins at the age of 50  and continues through age 75. However, your health care provider may recommend screening at an earlier age if you have risk factors for colon cancer. On a yearly basis, your health care provider may provide home test kits to check for hidden blood in the stool. A small camera at the end of a tube may be used to directly examine the colon (sigmoidoscopy or colonoscopy) to detect the earliest forms of colorectal cancer. Talk to your health care provider about this at age 50, when routine screening begins. A direct exam of the colon should be repeated every 5 10 years through age 75, unless early forms of pre-cancerous polyps or small growths are found.  People who are at an increased risk for hepatitis B should be screened for this virus. You are considered at high risk for hepatitis B if:  You were born in a country where hepatitis B occurs often. Talk with your health care provider about which countries are considered high-risk.  Your parents were born in a high-risk country and you have not received a shot to protect against hepatitis B (hepatitis B vaccine).  You have HIV or AIDS.  You use needles to inject street drugs.  You live with, or have sex with, someone who has hepatitis B.  You are a man who has sex with other men (MSM).  You get hemodialysis treatment.  You take certain medicines for conditions like cancer, organ transplantation, and autoimmune conditions.  Hepatitis C blood testing is recommended for all people born from 1945 through 1965 and any individual with known risk factors for hepatitis C.  Healthy men should no longer receive prostate-specific antigen (PSA) blood tests as part of routine cancer screening. Talk to your health care provider about prostate cancer screening.  Testicular cancer screening is not recommended for adolescents or adult males who have no symptoms. Screening includes self-exam, a health care provider exam, and other screening tests. Consult with  your health care provider about any symptoms you have or any concerns you have about testicular cancer.  Practice safe sex. Use condoms and avoid high-risk sexual practices to reduce the spread of sexually transmitted infections (STIs).  Use sunscreen. Apply sunscreen liberally and repeatedly throughout the day. You should seek shade when your shadow is shorter than you. Protect yourself by wearing long sleeves, pants, a wide-brimmed hat, and sunglasses year round, whenever you are outdoors.  Tell your health care provider of new moles or changes in moles, especially if there is a change in shape or color. Also tell your provider if a mole is larger than the size of a pencil eraser.  A one-time screening for abdominal aortic aneurysm (AAA) and surgical repair of large AAAs by ultrasound is recommended for men aged 65 75 years who are current or former smokers.  Stay current with your vaccines (immunizations). Document Released: 07/20/2007 Document Revised: 11/11/2012 Document Reviewed: 06/18/2010 [employer] Patient Information 2014 [employer].   Panic Attacks Panic attacks are sudden, short-livedsurges of severe anxiety, fear, or discomfort. They may occur for no reason when you are relaxed, when you are anxious, or when you are sleeping. Panic attacks may occur for a number of reasons:   Healthy people occasionally have panic attacks in extreme, life-threatening situations, such as war or natural disasters. Normal anxiety is a protective mechanism of the body that helps us react to danger (fight or flight response).  Panic attacks are often seen with anxiety disorders, such as panic disorder, social anxiety disorder, generalized anxiety disorder, and phobias. Anxiety disorders cause excessive or uncontrollable anxiety. They may interfere with your relationships or other life activities.  Panic attacks are sometimes seen with other mental illnesses such as depression and posttraumatic stress  disorder.  Certain medical conditions, prescription medicines, and drugs of abuse can cause panic attacks. SYMPTOMS  Panic attacks start suddenly, peak within 20 minutes, and are accompanied by four or more of the following symptoms:  Pounding heart or fast heart rate (palpitations).  Sweating.  Trembling or shaking.  Shortness of breath or feeling smothered.  Feeling choked.  Chest pain or discomfort.  Nausea or strange feeling in your stomach.  Dizziness, lightheadedness, or feeling like you will faint.  Chills or hot flushes.  Numbness or tingling in your lips or hands and feet.  Feeling that things are not real or feeling that you are not yourself.  Fear of losing control or going crazy.  Fear of dying. Some of these symptoms can mimic serious medical conditions. For example, you may think you are having a heart attack. Although panic attacks can be very scary, they are not life threatening. DIAGNOSIS  Panic attacks are diagnosed through an assessment by your health care provider. Your health care provider will ask questions about your symptoms, such as where and when they occurred. Your health care provider will also ask about your medical history and use of alcohol and drugs, including prescription medicines. Your health care provider may order blood tests or other studies to rule out a serious medical condition. Your health care provider may refer you to a mental health professional for further evaluation. TREATMENT   Most healthy people who have one or two panic attacks in an extreme, life-threatening situation will not require treatment.  The treatment for panic attacks associated with anxiety disorders or other mental illness typically involves counseling with a mental health professional, medicine, or a combination of both. Your health care provider will help determine what treatment is best for you.  Panic attacks due to physical illness usually goes away with  treatment of the illness. If prescription medicine is causing panic attacks, talk with your health care provider about stopping the medicine, decreasing the dose, or substituting another medicine.  Panic attacks due to alcohol or drug abuse goes away with abstinence. Some adults need professional help in order to stop drinking or using drugs. HOME CARE INSTRUCTIONS   Take all your medicines as prescribed.   Check with your health care provider before starting new prescription or over-the-counter medicines.  Keep all follow up appointments with your health care provider. SEEK MEDICAL CARE IF:  You are not able to take your medicines as prescribed.  Your symptoms do not improve or get worse. SEEK IMMEDIATE MEDICAL CARE IF:   You experience panic attack symptoms that are different than your usual symptoms.  You have serious thoughts about hurting yourself or others.  You are taking medicine for panic attacks  and have a serious side effect. MAKE SURE YOU:  Understand these instructions.  Will watch your condition.  Will get help right away if you are not doing well or get worse. Document Released: 01/21/2005 Document Revised: 11/11/2012 Document Reviewed: 09/04/2012 [facility] Patient Information 2014 [place], [place].

## 2013-06-07 NOTE — Progress Notes (Signed)
Pre-visit discussion using our clinic review tool, as applicable. No additional management support is needed unless otherwise documented below in the visit note.  

## 2013-06-07 NOTE — Assessment & Plan Note (Signed)
I asked him to start zoloft as directed Will cont to encourage him to taper off of klonopin but for now he feels like he needs it to control the panic and insomnia

## 2013-06-07 NOTE — Assessment & Plan Note (Signed)
Exam done Vaccines were reviewed Labs ordered Pt ed material was given 

## 2013-06-07 NOTE — Assessment & Plan Note (Signed)
He does not smoke or have hypoxemia. Based on his appearance I think this is due to anabolic steroid usage. I have asked him to return for further evaluation.

## 2013-06-14 ENCOUNTER — Other Ambulatory Visit: Payer: Self-pay | Admitting: Internal Medicine

## 2013-06-14 DIAGNOSIS — D751 Secondary polycythemia: Secondary | ICD-10-CM

## 2013-06-21 ENCOUNTER — Other Ambulatory Visit (INDEPENDENT_AMBULATORY_CARE_PROVIDER_SITE_OTHER): Payer: Managed Care, Other (non HMO)

## 2013-06-21 DIAGNOSIS — D751 Secondary polycythemia: Secondary | ICD-10-CM

## 2013-06-21 LAB — CBC WITH DIFFERENTIAL/PLATELET
Basophils Absolute: 0.1 10*3/uL (ref 0.0–0.1)
Basophils Relative: 0.7 % (ref 0.0–3.0)
EOS ABS: 0.4 10*3/uL (ref 0.0–0.7)
EOS PCT: 5.7 % — AB (ref 0.0–5.0)
HEMATOCRIT: 50.2 % (ref 39.0–52.0)
Hemoglobin: 16.8 g/dL (ref 13.0–17.0)
LYMPHS ABS: 1.8 10*3/uL (ref 0.7–4.0)
Lymphocytes Relative: 24.1 % (ref 12.0–46.0)
MCHC: 33.5 g/dL (ref 30.0–36.0)
MCV: 90.5 fl (ref 78.0–100.0)
MONO ABS: 0.8 10*3/uL (ref 0.1–1.0)
Monocytes Relative: 10.3 % (ref 3.0–12.0)
NEUTROS PCT: 59.2 % (ref 43.0–77.0)
Neutro Abs: 4.4 10*3/uL (ref 1.4–7.7)
PLATELETS: 169 10*3/uL (ref 150.0–400.0)
RBC: 5.55 Mil/uL (ref 4.22–5.81)
RDW: 15.1 % (ref 11.5–15.5)
WBC: 7.4 10*3/uL (ref 4.0–10.5)

## 2013-06-22 ENCOUNTER — Encounter: Payer: Self-pay | Admitting: Internal Medicine

## 2013-06-22 LAB — TESTOSTERONE, FREE, TOTAL, SHBG
Sex Hormone Binding: 39 nmol/L (ref 13–71)
TESTOSTERONE: 494 ng/dL (ref 300–890)
Testosterone, Free: 93.3 pg/mL (ref 47.0–244.0)
Testosterone-% Free: 1.9 % (ref 1.6–2.9)

## 2013-08-18 ENCOUNTER — Other Ambulatory Visit: Payer: Self-pay | Admitting: Internal Medicine

## 2013-10-27 ENCOUNTER — Ambulatory Visit: Payer: Self-pay | Admitting: Internal Medicine

## 2013-10-28 ENCOUNTER — Encounter: Payer: Self-pay | Admitting: Internal Medicine

## 2013-10-28 ENCOUNTER — Ambulatory Visit (INDEPENDENT_AMBULATORY_CARE_PROVIDER_SITE_OTHER): Payer: Managed Care, Other (non HMO) | Admitting: Internal Medicine

## 2013-10-28 VITALS — BP 116/84 | HR 64 | Temp 98.1°F | Resp 16 | Wt 227.0 lb

## 2013-10-28 DIAGNOSIS — J069 Acute upper respiratory infection, unspecified: Secondary | ICD-10-CM

## 2013-10-28 DIAGNOSIS — J202 Acute bronchitis due to streptococcus: Secondary | ICD-10-CM

## 2013-10-28 DIAGNOSIS — J209 Acute bronchitis, unspecified: Secondary | ICD-10-CM

## 2013-10-28 DIAGNOSIS — F411 Generalized anxiety disorder: Secondary | ICD-10-CM

## 2013-10-28 DIAGNOSIS — R05 Cough: Secondary | ICD-10-CM

## 2013-10-28 DIAGNOSIS — R059 Cough, unspecified: Secondary | ICD-10-CM

## 2013-10-28 MED ORDER — AZITHROMYCIN 500 MG PO TABS: 500.0000 mg | ORAL_TABLET | Freq: Every day | ORAL | Status: DC

## 2013-10-28 MED ORDER — CLONAZEPAM 1 MG PO TABS: 1.0000 mg | ORAL_TABLET | Freq: Three times a day (TID) | ORAL | Status: DC | PRN

## 2013-10-28 MED ORDER — HYDROCODONE-HOMATROPINE 5-1.5 MG/5ML PO SYRP: 5.0000 mL | ORAL_SOLUTION | Freq: Three times a day (TID) | ORAL | Status: DC | PRN

## 2013-10-28 NOTE — Assessment & Plan Note (Signed)
He will cont klonopin and sertraline

## 2013-10-28 NOTE — Assessment & Plan Note (Signed)
I will treat the infection with zithromax and hycodan for cough suppression

## 2013-10-28 NOTE — Patient Instructions (Signed)
Cough, Adult  A cough is a reflex that helps clear your throat and airways. It can help heal the body or may be a reaction to an irritated airway. A cough may only last 2 or 3 weeks (acute) or may last more than 8 weeks (chronic).  CAUSES Acute cough:  Viral or bacterial infections. Chronic cough:  Infections.  Allergies.  Asthma.  Post-nasal drip.  Smoking.  Heartburn or acid reflux.  Some medicines.  Chronic lung problems (COPD).  Cancer. SYMPTOMS   Cough.  Fever.  Chest pain.  Increased breathing rate.  High-pitched whistling sound when breathing (wheezing).  Colored mucus that you cough up (sputum). TREATMENT   A bacterial cough may be treated with antibiotic medicine.  A viral cough must run its course and will not respond to antibiotics.  Your caregiver may recommend other treatments if you have a chronic cough. HOME CARE INSTRUCTIONS   Only take over-the-counter or prescription medicines for pain, discomfort, or fever as directed by your caregiver. Use cough suppressants only as directed by your caregiver.  Use a cold steam vaporizer or humidifier in your bedroom or home to help loosen secretions.  Sleep in a semi-upright position if your cough is worse at night.  Rest as needed.  Stop smoking if you smoke. SEEK IMMEDIATE MEDICAL CARE IF:   You have pus in your sputum.  Your cough starts to worsen.  You cannot control your cough with suppressants and are losing sleep.  You begin coughing up blood.  You have difficulty breathing.  You develop pain which is getting worse or is uncontrolled with medicine.  You have a fever. MAKE SURE YOU:   Understand these instructions.  Will watch your condition.  Will get help right away if you are not doing well or get worse. Document Released: 07/20/2010 Document Revised: 04/15/2011 Document Reviewed: 07/20/2010 Exit[employer] Patient Information 2015 [employer]. This information is not intended  to replace advice given to you by your health care provider. Make sure you discuss any questions you have with your health care provider.  

## 2013-10-28 NOTE — Progress Notes (Signed)
Subjective:    Patient ID: Robert Werner, male    DOB: [birth year], 28 y.o.   MRN: [id number]  Cough This is a new problem. The current episode started in the past 7 days. The problem has been gradually worsening. The problem occurs every few hours. The cough is productive of purulent sputum. Associated symptoms include chills, a fever, rhinorrhea and a sore throat. Pertinent negatives include no chest pain, ear congestion, ear pain, headaches, heartburn, hemoptysis, myalgias, nasal congestion, postnasal drip, rash, shortness of breath, sweats, weight loss or wheezing. He has tried OTC cough suppressant for the symptoms. The treatment provided mild relief. There is no history of asthma, bronchiectasis, bronchitis, COPD, emphysema, environmental allergies or pneumonia.      Review of Systems  Constitutional: Positive for fever and chills. Negative for weight loss, diaphoresis, activity change, appetite change, fatigue and unexpected weight change.  HENT: Positive for rhinorrhea, sinus pressure and sore throat. Negative for ear pain, postnasal drip, sneezing, tinnitus and trouble swallowing.   Eyes: Negative.   Respiratory: Positive for cough. Negative for apnea, hemoptysis, choking, chest tightness, shortness of breath and wheezing.   Cardiovascular: Negative.  Negative for chest pain, palpitations and leg swelling.  Gastrointestinal: Negative.  Negative for heartburn, nausea, vomiting, abdominal pain, diarrhea, constipation and blood in stool.  Endocrine: Negative.   Genitourinary: Negative.   Musculoskeletal: Negative.  Negative for arthralgias, back pain, gait problem, joint swelling, myalgias and neck pain.  Skin: Negative.  Negative for rash.  Allergic/Immunologic: Negative.  Negative for environmental allergies.  Neurological: Negative.  Negative for headaches.  Hematological: Negative.  Negative for adenopathy. Does not bruise/bleed easily.  Psychiatric/Behavioral: Positive for sleep  disturbance. Negative for suicidal ideas, hallucinations, behavioral problems, self-injury, dysphoric mood, decreased concentration and agitation. The patient is nervous/anxious. The patient is not hyperactive.        Objective:   Physical Exam  Vitals reviewed. Constitutional: He is oriented to person, place, and time. He appears well-developed and well-nourished.  Non-toxic appearance. He does not have a sickly appearance. He does not appear ill. No distress.  HENT:  Head: Normocephalic and atraumatic.  Mouth/Throat: Oropharynx is clear and moist. No oropharyngeal exudate.  Eyes: Conjunctivae are normal. Right eye exhibits no discharge. Left eye exhibits no discharge. No scleral icterus.  Neck: Normal range of motion. Neck supple. No JVD present. No tracheal deviation present. No thyromegaly present.  Cardiovascular: Normal rate, regular rhythm, normal heart sounds and intact distal pulses.  Exam reveals no gallop and no friction rub.   No murmur heard. Pulmonary/Chest: Effort normal and breath sounds normal. No stridor. No respiratory distress. He has no wheezes. He has no rales. He exhibits no tenderness.  Abdominal: Soft. Bowel sounds are normal. He exhibits no distension and no mass. There is no tenderness. There is no rebound and no guarding.  Musculoskeletal: Normal range of motion. He exhibits no edema.  Lymphadenopathy:    He has no cervical adenopathy.  Neurological: He is oriented to person, place, and time.  Skin: Skin is warm and dry. No rash noted. He is not diaphoretic. No erythema. No pallor.  Psychiatric: His behavior is normal. Judgment and thought content normal. His mood appears anxious. His affect is not angry. His speech is not rapid and/or pressured. Cognition and memory are normal. He does not exhibit a depressed mood. He expresses no homicidal and no suicidal ideation. He expresses no suicidal plans and no homicidal plans.    Lab Results  Component Value Date  WBC 7.4 06/21/2013   HGB 16.8 06/21/2013   HCT 50.2 06/21/2013   PLT 169.0 06/21/2013   GLUCOSE 76 06/07/2013   CHOL 141 06/07/2013   TRIG 80.0 06/07/2013   HDL 52.90 06/07/2013   LDLCALC 72 06/07/2013   ALT 30 06/07/2013   AST 26 06/07/2013   NA 140 06/07/2013   K 5.0 06/07/2013   CL 106 06/07/2013   CREATININE 1.5 06/07/2013   BUN 14 06/07/2013   CO2 26 06/07/2013   TSH 1.39 06/07/2013        Assessment & Plan:

## 2013-10-28 NOTE — Assessment & Plan Note (Signed)
I will check his CXR for mass, infection, edema

## 2013-10-29 ENCOUNTER — Ambulatory Visit: Payer: Self-pay | Admitting: Internal Medicine

## 2014-01-03 ENCOUNTER — Other Ambulatory Visit: Payer: Self-pay | Admitting: Internal Medicine

## 2014-03-10 ENCOUNTER — Ambulatory Visit (INDEPENDENT_AMBULATORY_CARE_PROVIDER_SITE_OTHER): Payer: Managed Care, Other (non HMO) | Admitting: Internal Medicine

## 2014-03-10 ENCOUNTER — Encounter: Payer: Self-pay | Admitting: Internal Medicine

## 2014-03-10 VITALS — BP 122/80 | HR 63 | Temp 98.4°F | Resp 16 | Wt 231.0 lb

## 2014-03-10 DIAGNOSIS — F411 Generalized anxiety disorder: Secondary | ICD-10-CM

## 2014-03-10 DIAGNOSIS — J0121 Acute recurrent ethmoidal sinusitis: Secondary | ICD-10-CM | POA: Insufficient documentation

## 2014-03-10 MED ORDER — CLONAZEPAM 1 MG PO TABS: ORAL_TABLET | ORAL | Status: DC

## 2014-03-10 MED ORDER — AMOXICILLIN-POT CLAVULANATE 875-125 MG PO TABS: 1.0000 | ORAL_TABLET | Freq: Two times a day (BID) | ORAL | Status: AC

## 2014-03-10 NOTE — Patient Instructions (Signed)

## 2014-03-10 NOTE — Assessment & Plan Note (Signed)
He is doing well on klonopin and zoloft, will continue

## 2014-03-10 NOTE — Progress Notes (Signed)
Pre visit review using our clinic review tool, if applicable. No additional management support is needed unless otherwise documented below in the visit note. 

## 2014-03-10 NOTE — Progress Notes (Signed)
Subjective:    Patient ID: Robert FosterJohn Werner, male    DOB: 04/14/1985, 29 y.o.   MRN: [id number]  Sinusitis This is a recurrent problem. The current episode started more than 1 month ago. The problem has been gradually worsening since onset. There has been no fever. The fever has been present for less than 1 day. His pain is at a severity of 0/10. He is experiencing no pain. Associated symptoms include congestion, sinus pressure and sneezing. Pertinent negatives include no chills, coughing, diaphoresis, ear pain, headaches, hoarse voice, neck pain, shortness of breath, sore throat or swollen glands. Past treatments include oral decongestants. The treatment provided moderate relief.      Review of Systems  Constitutional: Negative.  Negative for fever, chills, diaphoresis, appetite change, fatigue and unexpected weight change.  HENT: Positive for congestion, postnasal drip, rhinorrhea, sinus pressure and sneezing. Negative for ear pain, hoarse voice, nosebleeds, sore throat, trouble swallowing and voice change.   Eyes: Negative.   Respiratory: Negative.  Negative for cough, choking, shortness of breath and stridor.   Cardiovascular: Negative.  Negative for chest pain, palpitations and leg swelling.  Gastrointestinal: Negative.  Negative for nausea, vomiting, abdominal pain, diarrhea, constipation and blood in stool.  Endocrine: Negative.   Genitourinary: Negative.   Musculoskeletal: Negative.  Negative for myalgias, back pain, arthralgias and neck pain.  Skin: Negative.  Negative for rash.  Allergic/Immunologic: Negative.   Neurological: Negative.  Negative for headaches.  Hematological: Negative.  Negative for adenopathy. Does not bruise/bleed easily.  Psychiatric/Behavioral: Negative for suicidal ideas, hallucinations, behavioral problems, confusion, sleep disturbance, self-injury, dysphoric mood, decreased concentration and agitation. The patient is nervous/anxious. The patient is not  hyperactive.        Objective:   Physical Exam  Constitutional: He is oriented to person, place, and time. He appears well-developed and well-nourished. No distress.  HENT:  Head: Normocephalic and atraumatic.  Right Ear: Hearing, tympanic membrane, external ear and ear canal normal.  Left Ear: Hearing, tympanic membrane, external ear and ear canal normal.  Nose: Mucosal edema and rhinorrhea present. No sinus tenderness, nasal deformity or nasal septal hematoma. Right sinus exhibits maxillary sinus tenderness. Right sinus exhibits no frontal sinus tenderness. Left sinus exhibits no maxillary sinus tenderness and no frontal sinus tenderness.  Mouth/Throat: Oropharynx is clear and moist and mucous membranes are normal. Mucous membranes are not pale, not dry and not cyanotic. No oral lesions. No trismus in the jaw. No uvula swelling. No oropharyngeal exudate, posterior oropharyngeal edema, posterior oropharyngeal erythema or tonsillar abscesses.  Eyes: Conjunctivae are normal. Right eye exhibits no discharge. Left eye exhibits no discharge. No scleral icterus.  Neck: Normal range of motion. Neck supple. No JVD present. No tracheal deviation present. No thyromegaly present.  Cardiovascular: Normal rate, normal heart sounds and intact distal pulses.  Exam reveals no gallop and no friction rub.   No murmur heard. Pulmonary/Chest: Effort normal and breath sounds normal. No stridor. No respiratory distress. He has no wheezes. He has no rales. He exhibits no tenderness.  Abdominal: Soft. Bowel sounds are normal. He exhibits no distension and no mass. There is no tenderness. There is no rebound and no guarding.  Lymphadenopathy:    He has no cervical adenopathy.  Neurological: He is oriented to person, place, and time.  Skin: Skin is warm and dry. No rash noted. He is not diaphoretic. No erythema. No pallor.  Vitals reviewed.    Lab Results  Component Value Date   WBC 7.4 06/21/2013  HGB 16.8  06/21/2013   HCT 50.2 06/21/2013   PLT 169.0 06/21/2013   GLUCOSE 76 06/07/2013   CHOL 141 06/07/2013   TRIG 80.0 06/07/2013   HDL 52.90 06/07/2013   LDLCALC 72 06/07/2013   ALT 30 06/07/2013   AST 26 06/07/2013   NA 140 06/07/2013   K 5.0 06/07/2013   CL 106 06/07/2013   CREATININE 1.5 06/07/2013   BUN 14 06/07/2013   CO2 26 06/07/2013   TSH 1.39 06/07/2013       Assessment & Plan:

## 2014-03-10 NOTE — Assessment & Plan Note (Signed)
Will treat the infection with augmentin 

## 2014-04-18 ENCOUNTER — Emergency Department (HOSPITAL_COMMUNITY)
Admission: EM | Admit: 2014-04-18 | Discharge: 2014-04-18 | Disposition: A | Discharge status: Home / Self Care | Payer: Managed Care, Other (non HMO) | Attending: Emergency Medicine | Admitting: Emergency Medicine

## 2014-04-18 ENCOUNTER — Encounter (HOSPITAL_COMMUNITY): Payer: Self-pay | Admitting: Emergency Medicine

## 2014-04-18 ENCOUNTER — Emergency Department (HOSPITAL_COMMUNITY): Payer: Managed Care, Other (non HMO)

## 2014-04-18 DIAGNOSIS — Y9367 Activity, basketball: Secondary | ICD-10-CM | POA: Insufficient documentation

## 2014-04-18 DIAGNOSIS — Y998 Other external cause status: Secondary | ICD-10-CM | POA: Diagnosis not present

## 2014-04-18 DIAGNOSIS — Y92310 Basketball court as the place of occurrence of the external cause: Secondary | ICD-10-CM | POA: Diagnosis not present

## 2014-04-18 DIAGNOSIS — F419 Anxiety disorder, unspecified: Secondary | ICD-10-CM | POA: Diagnosis not present

## 2014-04-18 DIAGNOSIS — S82891A Other fracture of right lower leg, initial encounter for closed fracture: Secondary | ICD-10-CM | POA: Diagnosis not present

## 2014-04-18 DIAGNOSIS — X58XXXA Exposure to other specified factors, initial encounter: Secondary | ICD-10-CM | POA: Insufficient documentation

## 2014-04-18 DIAGNOSIS — S99911A Unspecified injury of right ankle, initial encounter: Secondary | ICD-10-CM | POA: Diagnosis present

## 2014-04-18 DIAGNOSIS — Z79899 Other long term (current) drug therapy: Secondary | ICD-10-CM | POA: Insufficient documentation

## 2014-04-18 MED ORDER — HYDROCODONE-ACETAMINOPHEN 5-325 MG PO TABS
1.0000 | ORAL_TABLET | Freq: Once | ORAL | Status: AC
  Administered 2014-04-18: 1 via ORAL
  Filled 2014-04-18: qty 1

## 2014-04-18 MED ORDER — HYDROCODONE-ACETAMINOPHEN 5-325 MG PO TABS: 1.0000 | ORAL_TABLET | Freq: Four times a day (QID) | ORAL | Status: DC | PRN

## 2014-04-18 NOTE — Discharge Instructions (Signed)
As discussed you have a very small avulsion fracture or small chip pulled off with the ligament attaches to the bone.  This is the second fracture you have and the same area, even placed in a Cam Walker, which is a removable splint, wear this for comfort for the next 2 weeks and then as needed.  You've also been given a referral to orthopedics for further evaluation if you continue to have pain and swelling of the area

## 2014-04-18 NOTE — ED Notes (Signed)
Pt states that he was playing basketball and came down wrong on R ankle. Ankle is swollen. Alert and oriented.

## 2014-04-18 NOTE — ED Provider Notes (Signed)
CSN: [id number]     Arrival date & time 04/18/14  2203 History  This chart was scribed for Earley FavorGail Jacquan Savas, NP working with Linwood DibblesJon Knapp, MD by Elveria Risingimelie Horne, ED Scribe. This patient was seen in room WTR3/WLPT3 and the patient's care was started at 10:23 PM.   No chief complaint on file.  HPI HPI Comments: Robert Werner is a 29 y.o. male who presents to the Emergency Department complaining of right ankle injury incurred less than one hour ago. Patient reports inverting the ankle on landing while playing basketball today. Patient reports numerous right ankle sprains: 5-6 over this lifetime, but he denies any injury this severe. Patient denies additional injuries.   Past Medical History  Diagnosis Date  . Anxiety    No past surgical history on file. Family History  Problem Relation Age of Onset  . Alcohol abuse Other   . Arthritis Other   . Drug abuse Other   . Hyperlipidemia Other   . Hypertension Other   . Stroke Other    History  Substance Use Topics  . Smoking status: Never Smoker   . Smokeless tobacco: Never Used  . Alcohol Use: 3.0 oz/week    5 Glasses of wine, 0 Cans of beer per week    Review of Systems  Constitutional: Negative for fever and chills.  Musculoskeletal: Positive for joint swelling and arthralgias.  Neurological: Negative for weakness and numbness.      Allergies  Shellfish allergy  Home Medications   Prior to Admission medications   Medication Sig Start Date End Date Taking? Authorizing Provider  clonazePAM (KLONOPIN) 1 MG tablet TAKE 1 TABLET BY MOUTH 3 TIMES A DAY AS NEEDED FOR ANXIETY 03/10/14  Yes Etta Grandchildhomas L Jones, MD  sertraline (ZOLOFT) 50 MG tablet Take 1 tablet (50 mg total) by mouth daily. Patient not taking: Reported on 04/18/2014 06/07/13 07/17/14  Etta Grandchildhomas L Jones, MD   Triage Vitals: BP 144/63 mmHg  Pulse 81  Temp(Src) 98.5 F (36.9 C) (Oral)  Resp 22  Ht 6' (1.829 m)  Wt 220 lb (99.791 kg)  BMI 29.83 kg/m2  SpO2 100% Physical Exam  ED Course   Procedures (including critical care time)  COORDINATION OF CARE: 10:25 PM- Discussed treatment plan with patient at bedside and patient agreed to plan.   Labs Review Labs Reviewed - No data to display  Imaging Review No results found.   EKG Interpretation None     Patient has a small chip fracture at the distal fibula is the second small fracture that he's had in the same area.  Patient was unaware of the first fracture.  He just thought he had a bad sprain at that time we placed in a Cam Walker and given referral to orthopedics for follow-up MDM   Final diagnoses:  None    I personally performed the services described in this documentation, which was scribed in my presence. The recorded information has been reviewed and is accurate.   Earley FavorGail Chidinma Clites, NP 04/18/14 2322  Eber HongBrian Miller, MD 04/19/14 [phone]

## 2014-04-22 ENCOUNTER — Telehealth: Payer: Self-pay | Admitting: Internal Medicine

## 2014-04-22 MED ORDER — HYDROCODONE-ACETAMINOPHEN 5-325 MG PO TABS: 1.0000 | ORAL_TABLET | Freq: Four times a day (QID) | ORAL | Status: DC | PRN

## 2014-04-22 NOTE — Telephone Encounter (Signed)
Patient notified

## 2014-04-22 NOTE — Telephone Encounter (Signed)
Will refill and if need more would recommend being seen next week.

## 2014-04-22 NOTE — Telephone Encounter (Signed)
MD out of the office until Monday,will forward to another MD to see if someone will approve.

## 2014-04-22 NOTE — Telephone Encounter (Signed)
Patient fell fractured his rt ankles, went to urgent care, told him to call his primary about getting a refill  of his pain medication, he will be out in two days. please advise

## 2014-05-17 ENCOUNTER — Other Ambulatory Visit: Payer: Self-pay | Admitting: Internal Medicine

## 2014-05-18 ENCOUNTER — Ambulatory Visit (INDEPENDENT_AMBULATORY_CARE_PROVIDER_SITE_OTHER): Payer: Managed Care, Other (non HMO) | Admitting: Internal Medicine

## 2014-05-18 VITALS — HR 74 | Temp 98.5°F | Resp 16 | Ht 72.0 in | Wt 219.0 lb

## 2014-05-18 DIAGNOSIS — F411 Generalized anxiety disorder: Secondary | ICD-10-CM | POA: Diagnosis not present

## 2014-05-18 MED ORDER — SERTRALINE HCL 100 MG PO TABS: 100.0000 mg | ORAL_TABLET | Freq: Every day | ORAL | Status: AC

## 2014-05-18 MED ORDER — CLONAZEPAM 1 MG PO TABS: ORAL_TABLET | ORAL | Status: DC

## 2014-05-18 NOTE — Patient Instructions (Signed)
Generalized Anxiety Disorder Generalized anxiety disorder (GAD) is a mental disorder. It interferes with life functions, including relationships, work, and school. GAD is different from normal anxiety, which everyone experiences at some point in their lives in response to specific life events and activities. Normal anxiety actually helps us prepare for and get through these life events and activities. Normal anxiety goes away after the event or activity is over.  GAD causes anxiety that is not necessarily related to specific events or activities. It also causes excess anxiety in proportion to specific events or activities. The anxiety associated with GAD is also difficult to control. GAD can vary from mild to severe. People with severe GAD can have intense waves of anxiety with physical symptoms (panic attacks).  SYMPTOMS The anxiety and worry associated with GAD are difficult to control. This anxiety and worry are related to many life events and activities and also occur more days than not for 6 months or longer. People with GAD also have three or more of the following symptoms (one or more in children):  Restlessness.   Fatigue.  Difficulty concentrating.   Irritability.  Muscle tension.  Difficulty sleeping or unsatisfying sleep. DIAGNOSIS GAD is diagnosed through an assessment by your health care provider. Your health care provider will ask you questions aboutyour mood,physical symptoms, and events in your life. Your health care provider may ask you about your medical history and use of alcohol or drugs, including prescription medicines. Your health care provider may also do a physical exam and blood tests. Certain medical conditions and the use of certain substances can cause symptoms similar to those associated with GAD. Your health care provider may refer you to a mental health specialist for further evaluation. TREATMENT The following therapies are usually used to treat GAD:    Medication. Antidepressant medication usually is prescribed for long-term daily control. Antianxiety medicines may be added in severe cases, especially when panic attacks occur.   Talk therapy (psychotherapy). Certain types of talk therapy can be helpful in treating GAD by providing support, education, and guidance. A form of talk therapy called cognitive behavioral therapy can teach you healthy ways to think about and react to daily life events and activities.  Stress managementtechniques. These include yoga, meditation, and exercise and can be very helpful when they are practiced regularly. A mental health specialist can help determine which treatment is best for you. Some people see improvement with one therapy. However, other people require a combination of therapies. Document Released: 05/18/2012 Document Revised: 06/07/2013 Document Reviewed: 05/18/2012 Exit[employer] Patient Information 2015 [employer]. This information is not intended to replace advice given to you by your health care provider. Make sure you discuss any questions you have with your health care provider.  

## 2014-05-19 ENCOUNTER — Encounter: Payer: Self-pay | Admitting: Internal Medicine

## 2014-05-19 NOTE — Progress Notes (Signed)
   Subjective:    Patient ID: Robert Werner, male    DOB: 05/20/1985, 29 y.o.   MRN: [id number]  HPI Comments: He returns for f/up on anxiety - he has had several stressors over the past few months and has felt more anxious and has been taking klonopin a little more than the Rx was written for. He tells me that he has been doing psychotherapy with Dr. Loa Socksonstantino in [place] and he finds that helpful.     Review of Systems  Constitutional: Negative.  Negative for fever, chills, diaphoresis, appetite change and fatigue.  HENT: Negative.   Eyes: Negative.   Respiratory: Negative.   Cardiovascular: Negative.  Negative for leg swelling.  Gastrointestinal: Negative.  Negative for abdominal pain.  Endocrine: Negative.   Genitourinary: Negative.   Musculoskeletal: Negative.   Skin: Negative.   Allergic/Immunologic: Negative.   Neurological: Negative.  Negative for dizziness.  Hematological: Negative.  Negative for adenopathy. Does not bruise/bleed easily.  Psychiatric/Behavioral: Positive for dysphoric mood. Negative for suicidal ideas, hallucinations, behavioral problems, confusion, sleep disturbance, self-injury, decreased concentration and agitation. The patient is nervous/anxious. The patient is not hyperactive.        Objective:   Physical Exam  Constitutional: He is oriented to person, place, and time. He appears well-developed and well-nourished.  Non-toxic appearance. He does not have a sickly appearance. He does not appear ill. No distress.  HENT:  Head: Normocephalic and atraumatic.  Mouth/Throat: Oropharynx is clear and moist. No oropharyngeal exudate.  Eyes: Conjunctivae are normal. Right eye exhibits no discharge. Left eye exhibits no discharge. No scleral icterus.  Neck: Normal range of motion. Neck supple. No JVD present. No tracheal deviation present. No thyromegaly present.  Cardiovascular: Normal rate, regular rhythm, normal heart sounds and intact distal pulses.  Exam  reveals no gallop and no friction rub.   No murmur heard. Pulmonary/Chest: Effort normal and breath sounds normal. No stridor. No respiratory distress. He has no wheezes. He has no rales. He exhibits no tenderness.  Abdominal: Soft. Bowel sounds are normal. He exhibits no distension and no mass. There is no tenderness. There is no rebound and no guarding.  Musculoskeletal: Normal range of motion. He exhibits no edema or tenderness.  Lymphadenopathy:    He has no cervical adenopathy.  Neurological: He is oriented to person, place, and time.  Skin: Skin is warm and dry. No rash noted. He is not diaphoretic. No erythema. No pallor.  Psychiatric: His behavior is normal. Judgment normal. His mood appears anxious. His affect is not angry, not blunt, not labile and not inappropriate. His speech is not tangential. He is not agitated, not aggressive, not hyperactive, not slowed, not withdrawn, not actively hallucinating and not combative. Cognition and memory are normal. Cognition and memory are not impaired. He does not exhibit a depressed mood. He expresses no homicidal and no suicidal ideation. He expresses no suicidal plans and no homicidal plans. He exhibits normal recent memory and normal remote memory. He is attentive.  Vitals reviewed.         Assessment & Plan:

## 2014-05-19 NOTE — Assessment & Plan Note (Signed)
Will increase sertraline from 50 mg QD to 100 mg QD He will cont psychotherapy He agrees to manage anxiety and panic in ways other than increasing the klonopin dose

## 2014-07-26 ENCOUNTER — Other Ambulatory Visit: Payer: Self-pay

## 2014-07-26 DIAGNOSIS — F411 Generalized anxiety disorder: Secondary | ICD-10-CM

## 2014-07-26 NOTE — Telephone Encounter (Signed)
Received refill request from [facility]  request refills for clonazepam 1 mg . Rx last written 05/18/14 2rf and pt last seen 05/18/14 . Please advise Thanks  * Patient has schedule a follow up appt

## 2014-07-26 NOTE — Telephone Encounter (Signed)
Patient called him.  Gave him MD response

## 2014-07-26 NOTE — Telephone Encounter (Signed)
It is too early for a refill request

## 2014-07-27 ENCOUNTER — Encounter: Payer: Self-pay | Admitting: Internal Medicine

## 2014-07-27 ENCOUNTER — Ambulatory Visit (INDEPENDENT_AMBULATORY_CARE_PROVIDER_SITE_OTHER): Payer: Managed Care, Other (non HMO) | Admitting: Internal Medicine

## 2014-07-27 VITALS — BP 132/86 | HR 63 | Temp 98.1°F | Resp 16 | Ht 72.0 in | Wt 219.0 lb

## 2014-07-27 DIAGNOSIS — F411 Generalized anxiety disorder: Secondary | ICD-10-CM | POA: Diagnosis not present

## 2014-07-27 MED ORDER — CLONAZEPAM 1 MG PO TABS: ORAL_TABLET | ORAL | Status: AC

## 2014-07-27 MED ORDER — TRAZODONE HCL 50 MG PO TABS: 25.0000 mg | ORAL_TABLET | Freq: Every day | ORAL | Status: AC

## 2014-07-27 NOTE — Progress Notes (Signed)
Pre visit review using our clinic review tool, if applicable. No additional management support is needed unless otherwise documented below in the visit note. 

## 2014-07-27 NOTE — Patient Instructions (Signed)
Generalized Anxiety Disorder Generalized anxiety disorder (GAD) is a mental disorder. It interferes with life functions, including relationships, work, and school. GAD is different from normal anxiety, which everyone experiences at some point in their lives in response to specific life events and activities. Normal anxiety actually helps us prepare for and get through these life events and activities. Normal anxiety goes away after the event or activity is over.  GAD causes anxiety that is not necessarily related to specific events or activities. It also causes excess anxiety in proportion to specific events or activities. The anxiety associated with GAD is also difficult to control. GAD can vary from mild to severe. People with severe GAD can have intense waves of anxiety with physical symptoms (panic attacks).  SYMPTOMS The anxiety and worry associated with GAD are difficult to control. This anxiety and worry are related to many life events and activities and also occur more days than not for 6 months or longer. People with GAD also have three or more of the following symptoms (one or more in children):  Restlessness.   Fatigue.  Difficulty concentrating.   Irritability.  Muscle tension.  Difficulty sleeping or unsatisfying sleep. DIAGNOSIS GAD is diagnosed through an assessment by your health care provider. Your health care provider will ask you questions aboutyour mood,physical symptoms, and events in your life. Your health care provider may ask you about your medical history and use of alcohol or drugs, including prescription medicines. Your health care provider may also do a physical exam and blood tests. Certain medical conditions and the use of certain substances can cause symptoms similar to those associated with GAD. Your health care provider may refer you to a mental health specialist for further evaluation. TREATMENT The following therapies are usually used to treat GAD:    Medication. Antidepressant medication usually is prescribed for long-term daily control. Antianxiety medicines may be added in severe cases, especially when panic attacks occur.   Talk therapy (psychotherapy). Certain types of talk therapy can be helpful in treating GAD by providing support, education, and guidance. A form of talk therapy called cognitive behavioral therapy can teach you healthy ways to think about and react to daily life events and activities.  Stress managementtechniques. These include yoga, meditation, and exercise and can be very helpful when they are practiced regularly. A mental health specialist can help determine which treatment is best for you. Some people see improvement with one therapy. However, other people require a combination of therapies. Document Released: 05/18/2012 Document Revised: 06/07/2013 Document Reviewed: 05/18/2012 Exit[employer] Patient Information 2015 [employer]. This information is not intended to replace advice given to you by your health care provider. Make sure you discuss any questions you have with your health care provider.  

## 2014-07-28 NOTE — Progress Notes (Signed)
Subjective:  Patient ID: Robert Werner, male    DOB: 1985-09-15  Age: 29 y.o. MRN: [id number]  CC: Depression   HPI Robert Werner presents for follow up on anxiety and panic, when I saw him 2 months ago he was given an Rx for klonopin that should have lasted for 3 months but he called yesterday for a refill and was told that it was too early, his pharmacy tells me that they have allowed for early refills at his request. During our last visit he committed to me that he would let #75 doses last for an entire month and he tells me that was his intention but he was not able to limit his intake to allow that to happen. He feels like he has too much anxiety and panic to resist the urge and takes more than #75 doses per month. He also complains of insomnia and wants to try trazodone again. He confirmed with me today that he is compliant with sertraline.  Outpatient Prescriptions Prior to Visit  Medication Sig Dispense Refill  . sertraline (ZOLOFT) 100 MG tablet Take 1 tablet (100 mg total) by mouth daily. 30 tablet 11  . clonazePAM (KLONOPIN) 1 MG tablet TAKE 1 TABLET BY MOUTH 3 TIMES A DAY AS NEEDED FOR ANXIETY 75 tablet 2   No facility-administered medications prior to visit.    ROS Review of Systems  Constitutional: Negative.  Negative for fever, chills, diaphoresis, appetite change and fatigue.  HENT: Negative.   Eyes: Negative.   Respiratory: Negative.  Negative for cough, choking, chest tightness and stridor.   Cardiovascular: Negative.  Negative for chest pain, palpitations and leg swelling.  Gastrointestinal: Negative.  Negative for abdominal pain.  Endocrine: Negative.   Genitourinary: Negative.   Musculoskeletal: Negative.   Skin: Negative.   Allergic/Immunologic: Negative.   Neurological: Negative.  Negative for dizziness, tremors, weakness and light-headedness.  Hematological: Negative.  Negative for adenopathy. Does not bruise/bleed easily.  Psychiatric/Behavioral: Positive for sleep  disturbance and dysphoric mood. Negative for suicidal ideas, hallucinations, behavioral problems, confusion, self-injury, decreased concentration and agitation. The patient is nervous/anxious. The patient is not hyperactive.     Objective:  BP 132/86 mmHg  Pulse 63  Temp(Src) 98.1 F (36.7 C) (Oral)  Resp 16  Ht 6' (1.829 m)  Wt 219 lb (99.338 kg)  BMI 29.70 kg/m2  SpO2 98%  BP Readings from Last 3 Encounters:  07/27/14 132/86  04/18/14 144/63  03/10/14 122/80    Wt Readings from Last 3 Encounters:  07/27/14 219 lb (99.338 kg)  05/18/14 219 lb (99.338 kg)  04/18/14 220 lb (99.791 kg)    Physical Exam  Constitutional: He is oriented to person, place, and time. He appears well-developed and well-nourished.  Non-toxic appearance. He does not have a sickly appearance. He does not appear ill. No distress.  HENT:  Head: Normocephalic and atraumatic.  Mouth/Throat: Oropharynx is clear and moist. No oropharyngeal exudate.  Eyes: Conjunctivae are normal. Right eye exhibits no discharge. Left eye exhibits no discharge. No scleral icterus.  Neck: Normal range of motion. Neck supple. No JVD present. No tracheal deviation present. No thyromegaly present.  Cardiovascular: Normal rate, regular rhythm, normal heart sounds and intact distal pulses.  Exam reveals no gallop and no friction rub.   No murmur heard. Pulmonary/Chest: Effort normal and breath sounds normal. No stridor. No respiratory distress. He has no wheezes. He has no rales. He exhibits no tenderness.  Abdominal: Soft. Bowel sounds are normal. He exhibits no  distension and no mass. There is no tenderness. There is no rebound and no guarding.  Musculoskeletal: Normal range of motion. He exhibits no edema or tenderness.  Lymphadenopathy:    He has no cervical adenopathy.  Neurological: He is oriented to person, place, and time.  Skin: Skin is warm and dry. No rash noted. He is not diaphoretic. No erythema. No pallor.    Psychiatric: His behavior is normal. Judgment and thought content normal. His mood appears anxious. His affect is not angry, not blunt, not labile and not inappropriate. His speech is not rapid and/or pressured, not delayed and not tangential. He is not agitated, not hyperactive, not slowed and not withdrawn. Cognition and memory are normal. He does not exhibit a depressed mood. He expresses no homicidal and no suicidal ideation. He expresses no suicidal plans and no homicidal plans.  Vitals reviewed.   Lab Results  Component Value Date   WBC 7.4 06/21/2013   HGB 16.8 06/21/2013   HCT 50.2 06/21/2013   PLT 169.0 06/21/2013   GLUCOSE 76 06/07/2013   CHOL 141 06/07/2013   TRIG 80.0 06/07/2013   HDL 52.90 06/07/2013   LDLCALC 72 06/07/2013   ALT 30 06/07/2013   AST 26 06/07/2013   NA 140 06/07/2013   K 5.0 06/07/2013   CL 106 06/07/2013   CREATININE 1.5 06/07/2013   BUN 14 06/07/2013   CO2 26 06/07/2013   TSH 1.39 06/07/2013    Dg Ankle Complete Right  04/18/2014   CLINICAL DATA:  Inversion injury  EXAM: RIGHT ANKLE - COMPLETE 3+ VIEW  COMPARISON:  None.  FINDINGS: There is marked lateral malleolar soft tissue swelling. There are old fragments at the fibular tip. A few tiny fragments in the area are of indeterminate age but might represent superimposed acute avulsions. Mortise is symmetric.  IMPRESSION: The larger fragments adjacent to the fibular tip are clearly old. However, the smaller fragments could represent superimposed acute avulsions. There is marked lateral malleolar soft tissue swelling.   Electronically Signed   By: Ellery Plunk M.D.   On: 04/18/2014 22:54    Assessment & Plan:   Robert Werner was seen today for depression.  Diagnoses and all orders for this visit:  GAD (generalized anxiety disorder) - I will cont the current dose of klonopin for now but have reminded him that #75 doses needs to last for one month, I have written on his RX that there should not be early  refills, we have sent a UDS to [facility] toxicology to confirm compliance with the current meds and to screen for additional substances. Will restart trazodone. Based on these findings will make future decisions about prescriptions. Orders: -     traZODone (DESYREL) 50 MG tablet; Take 0.5 tablets (25 mg total) by mouth at bedtime. -     clonazePAM (KLONOPIN) 1 MG tablet; TAKE 1 TABLET BY MOUTH 3 TIMES A DAY AS NEEDED FOR ANXIETY   I am having Robert Werner start on traZODone. I am also having him maintain his sertraline and clonazePAM.  Meds ordered this encounter  Medications  . traZODone (DESYREL) 50 MG tablet    Sig: Take 0.5 tablets (25 mg total) by mouth at bedtime.    Dispense:  90 tablet    Refill:  1  . clonazePAM (KLONOPIN) 1 MG tablet    Sig: TAKE 1 TABLET BY MOUTH 3 TIMES A DAY AS NEEDED FOR ANXIETY    Dispense:  75 tablet    Refill:  2  DO NOT ALLOW EARLY REFILLS     Follow-up: Return in about 3 months (around 10/27/2014).  Sanda Linger, MD

## 2014-08-01 ENCOUNTER — Encounter: Payer: Self-pay | Admitting: Internal Medicine

## 2014-08-02 ENCOUNTER — Encounter: Payer: Self-pay | Admitting: Internal Medicine

## 2014-08-02 ENCOUNTER — Telehealth: Payer: Self-pay | Admitting: Internal Medicine

## 2014-08-02 NOTE — Telephone Encounter (Signed)
Patient dismissed from [facility] by Sanda Lingerhomas Jones, M.D. effective August 01, 2014. Dismissal letter sent out by certified / registered mail on August 03, 2014. [facility]  Received signed domestic return receipt verifying delivery of certified letter on August 17, 2014 . Article number [id number]

## 2014-10-07 ENCOUNTER — Encounter (HOSPITAL_COMMUNITY): Payer: Self-pay | Admitting: Emergency Medicine

## 2014-10-07 ENCOUNTER — Emergency Department (HOSPITAL_COMMUNITY)
Admission: EM | Admit: 2014-10-07 | Discharge: 2014-10-07 | Disposition: A | Discharge status: Home / Self Care | Payer: Managed Care, Other (non HMO) | Attending: Emergency Medicine | Admitting: Emergency Medicine

## 2014-10-07 DIAGNOSIS — X58XXXA Exposure to other specified factors, initial encounter: Secondary | ICD-10-CM | POA: Diagnosis not present

## 2014-10-07 DIAGNOSIS — F419 Anxiety disorder, unspecified: Secondary | ICD-10-CM | POA: Insufficient documentation

## 2014-10-07 DIAGNOSIS — S8992XA Unspecified injury of left lower leg, initial encounter: Secondary | ICD-10-CM | POA: Insufficient documentation

## 2014-10-07 DIAGNOSIS — Y998 Other external cause status: Secondary | ICD-10-CM | POA: Diagnosis not present

## 2014-10-07 DIAGNOSIS — Z79899 Other long term (current) drug therapy: Secondary | ICD-10-CM | POA: Diagnosis not present

## 2014-10-07 DIAGNOSIS — Y9289 Other specified places as the place of occurrence of the external cause: Secondary | ICD-10-CM | POA: Diagnosis not present

## 2014-10-07 DIAGNOSIS — Y9367 Activity, basketball: Secondary | ICD-10-CM | POA: Diagnosis not present

## 2014-10-07 DIAGNOSIS — M25562 Pain in left knee: Secondary | ICD-10-CM

## 2014-10-07 MED ORDER — NAPROXEN 500 MG PO TABS
500.0000 mg | ORAL_TABLET | Freq: Once | ORAL | Status: AC
  Administered 2014-10-07: 500 mg via ORAL
  Filled 2014-10-07: qty 1

## 2014-10-07 MED ORDER — NAPROXEN 500 MG PO TABS: 500.0000 mg | ORAL_TABLET | Freq: Two times a day (BID) | ORAL | Status: AC

## 2014-10-07 NOTE — ED Provider Notes (Signed)
CSN: [id number]     Arrival date & time 10/07/14  4098 History   First MD Initiated Contact with Patient 10/07/14 1005     Chief Complaint  Patient presents with  . Knee Pain  . Knee Injury    l/knee pain ,sports related  . Joint Swelling    l/knee swelling     (Consider location/radiation/quality/duration/timing/severity/associated sxs/prior Treatment) The history is provided by the patient. No language interpreter was used.  Mr. Vanhoesen is a 29 year old male with a history of anxiety who presents for left knee painand a popping sensation while playing basketball last night. He states that while jumping and extending his left knee he felt a separation under the kneecap.He says that he was ambulatory after the injury and tried to continue playing basketball. He denies any previous knee injury or surgery. He states that he treated it with ice and took ibuprofen with minimal relief. The pain is relieved by rest and aggravated by walking and movement.  Past Medical History  Diagnosis Date  . Anxiety    History reviewed. No pertinent past surgical history. Family History  Problem Relation Age of Onset  . Alcohol abuse Other   . Arthritis Other   . Drug abuse Other   . Hyperlipidemia Other   . Hypertension Other   . Stroke Other   . Hypertension Mother    Social History  Substance Use Topics  . Smoking status: Never Smoker   . Smokeless tobacco: Never Used  . Alcohol Use: No    Review of Systems  Musculoskeletal: Positive for myalgias and arthralgias. Negative for joint swelling.  Skin: Negative for wound.      Allergies  Shellfish allergy  Home Medications   Prior to Admission medications   Medication Sig Start Date End Date Taking? Authorizing Provider  clonazePAM (KLONOPIN) 1 MG tablet TAKE 1 TABLET BY MOUTH 3 TIMES A DAY AS NEEDED FOR ANXIETY 07/27/14   Etta Grandchild, MD  naproxen (NAPROSYN) 500 MG tablet Take 1 tablet (500 mg total) by mouth 2 (two) times daily.  10/07/14   Arella Blinder Patel-Mills, PA-C  sertraline (ZOLOFT) 100 MG tablet Take 1 tablet (100 mg total) by mouth daily. 05/18/14   Etta Grandchild, MD  traZODone (DESYREL) 50 MG tablet Take 0.5 tablets (25 mg total) by mouth at bedtime. 07/27/14   Etta Grandchild, MD   BP 139/74 mmHg  Pulse 65  Temp(Src) 97.7 F (36.5 C) (Oral)  Resp 16  SpO2 100% Physical Exam  Constitutional: He is oriented to person, place, and time. He appears well-developed and well-nourished.  HENT:  Head: Normocephalic and atraumatic.  Eyes: Conjunctivae are normal.  Neck: Normal range of motion. Neck supple.  Cardiovascular: Normal rate.   Pulmonary/Chest: Effort normal. No respiratory distress.  Musculoskeletal: Normal range of motion.  Minimal tenderness to the medial aspect of the left knee.  He has full range of motion of the knee and is able to flex and extend the knee without difficulty. No joint effusion or ecchymosis of the knee. No posterior knee pain. He is able to dorsi and plantar flex without difficulty.  No fibular head or patellar tenderness to palpation. He is ambulatory.  Neurological: He is alert and oriented to person, place, and time.  Skin: Skin is warm and dry.  Psychiatric: He has a normal mood and affect. His behavior is normal.  Nursing note and vitals reviewed.   ED Course  Procedures (including critical care time) Labs Review Labs  Reviewed - No data to display  Imaging Review No results found. I have personally reviewed and evaluated these images and lab results as part of my medical decision-making.   EKG Interpretation None      MDM   Final diagnoses:  Knee pain, acute, left   Patient presents for left knee pain after playing basketball yesterday night. I reviewed the [place] knee rules and I do not believe he needs imaging. I believe this is a muscle or ligament injury.I do not suspect a quadriceps tendon rupture. He was put in a knee sleeve. I discussed RICE and that he should  follow up with his primary care physician. He was given naproxen and agrees with the plan.   Medications  naproxen (NAPROSYN) tablet 500 mg (500 mg Oral Given 10/07/14 1025)      Catha Gosselin, PA-C 10/07/14 1034  Lorre Nick, MD 10/07/14 1450

## 2014-10-07 NOTE — Discharge Instructions (Signed)
Knee Pain Follow up with your primary care physician.  Take naproxen for pain. Rest. Ice. Compress. Elevate the knee.  The knee is the complex joint between your thigh and your lower leg. It is made up of bones, tendons, ligaments, and cartilage. The bones that make up the knee are:  The femur in the thigh.  The tibia and fibula in the lower leg.  The patella or kneecap riding in the groove on the lower femur. CAUSES  Knee pain is a common complaint with many causes. A few of these causes are:  Injury, such as:  A ruptured ligament or tendon injury.  Torn cartilage.  Medical conditions, such as:  Gout  Arthritis  Infections  Overuse, over training, or overdoing a physical activity. Knee pain can be minor or severe. Knee pain can accompany debilitating injury. Minor knee problems often respond well to self-care measures or get well on their own. More serious injuries may need medical intervention or even surgery. SYMPTOMS The knee is complex. Symptoms of knee problems can vary widely. Some of the problems are:  Pain with movement and weight bearing.  Swelling and tenderness.  Buckling of the knee.  Inability to straighten or extend your knee.  Your knee locks and you cannot straighten it.  Warmth and redness with pain and fever.  Deformity or dislocation of the kneecap. DIAGNOSIS  Determining what is wrong may be very straight forward such as when there is an injury. It can also be challenging because of the complexity of the knee. Tests to make a diagnosis may include:  Your caregiver taking a history and doing a physical exam.  Routine X-rays can be used to rule out other problems. X-rays will not reveal a cartilage tear. Some injuries of the knee can be diagnosed by:  Arthroscopy a surgical technique by which a small video camera is inserted through tiny incisions on the sides of the knee. This procedure is used to examine and repair internal knee joint problems.  Tiny instruments can be used during arthroscopy to repair the torn knee cartilage (meniscus).  Arthrography is a radiology technique. A contrast liquid is directly injected into the knee joint. Internal structures of the knee joint then become visible on X-ray film.  An MRI scan is a non X-ray radiology procedure in which magnetic fields and a computer produce two- or three-dimensional images of the inside of the knee. Cartilage tears are often visible using an MRI scanner. MRI scans have largely replaced arthrography in diagnosing cartilage tears of the knee.  Blood work.  Examination of the fluid that helps to lubricate the knee joint (synovial fluid). This is done by taking a sample out using a needle and a syringe. TREATMENT The treatment of knee problems depends on the cause. Some of these treatments are:  Depending on the injury, proper casting, splinting, surgery, or physical therapy care will be needed.  Give yourself adequate recovery time. Do not overuse your joints. If you begin to get sore during workout routines, back off. Slow down or do fewer repetitions.  For repetitive activities such as cycling or running, maintain your strength and nutrition.  Alternate muscle groups. For example, if you are a weight lifter, work the upper body on one day and the lower body the next.  Either tight or weak muscles do not give the proper support for your knee. Tight or weak muscles do not absorb the stress placed on the knee joint. Keep the muscles surrounding the knee  strong.  Take care of mechanical problems.  If you have flat feet, orthotics or special shoes may help. See your caregiver if you need help.  Arch supports, sometimes with wedges on the inner or outer aspect of the heel, can help. These can shift pressure away from the side of the knee most bothered by osteoarthritis.  A brace called an "unloader" brace also may be used to help ease the pressure on the most arthritic side  of the knee.  If your caregiver has prescribed crutches, braces, wraps or ice, use as directed. The acronym for this is PRICE. This means protection, rest, ice, compression, and elevation.  Nonsteroidal anti-inflammatory drugs (NSAIDs), can help relieve pain. But if taken immediately after an injury, they may actually increase swelling. Take NSAIDs with food in your stomach. Stop them if you develop stomach problems. Do not take these if you have a history of ulcers, stomach pain, or bleeding from the bowel. Do not take without your caregiver's approval if you have problems with fluid retention, heart failure, or kidney problems.  For ongoing knee problems, physical therapy may be helpful.  Glucosamine and chondroitin are over-the-counter dietary supplements. Both may help relieve the pain of osteoarthritis in the knee. These medicines are different from the usual anti-inflammatory drugs. Glucosamine may decrease the rate of cartilage destruction.  Injections of a corticosteroid drug into your knee joint may help reduce the symptoms of an arthritis flare-up. They may provide pain relief that lasts a few months. You may have to wait a few months between injections. The injections do have a small increased risk of infection, water retention, and elevated blood sugar levels.  Hyaluronic acid injected into damaged joints may ease pain and provide lubrication. These injections may work by reducing inflammation. A series of shots may give relief for as long as 6 months.  Topical painkillers. Applying certain ointments to your skin may help relieve the pain and stiffness of osteoarthritis. Ask your pharmacist for suggestions. Many over the-counter products are approved for temporary relief of arthritis pain.  In some countries, doctors often prescribe topical NSAIDs for relief of chronic conditions such as arthritis and tendinitis. A review of treatment with NSAID creams found that they worked as well as  oral medications but without the serious side effects. PREVENTION  Maintain a healthy weight. Extra pounds put more strain on your joints.  Get strong, stay limber. Weak muscles are a common cause of knee injuries. Stretching is important. Include flexibility exercises in your workouts.  Be smart about exercise. If you have osteoarthritis, chronic knee pain or recurring injuries, you may need to change the way you exercise. This does not mean you have to stop being active. If your knees ache after jogging or playing basketball, consider switching to swimming, water aerobics, or other low-impact activities, at least for a few days a week. Sometimes limiting high-impact activities will provide relief.  Make sure your shoes fit well. Choose footwear that is right for your sport.  Protect your knees. Use the proper gear for knee-sensitive activities. Use kneepads when playing volleyball or laying carpet. Buckle your seat belt every time you drive. Most shattered kneecaps occur in car accidents.  Rest when you are tired. SEEK MEDICAL CARE IF:  You have knee pain that is continual and does not seem to be getting better.  SEEK IMMEDIATE MEDICAL CARE IF:  Your knee joint feels hot to the touch and you have a high fever. MAKE SURE YOU:  Understand these instructions.  Will watch your condition.  Will get help right away if you are not doing well or get worse. Document Released: 11/18/2006 Document Revised: 04/15/2011 Document Reviewed: 11/18/2006 [facility] Patient Information 2015 [place], [place]. This information is not intended to replace advice given to you by your health care provider. Make sure you discuss any questions you have with your health care provider.

## 2014-10-07 NOTE — ED Notes (Signed)
Pt stated that he felt a "popping "sensation in l/knee while playing basket ball last night. Pt jumped while l/leg was extended, felt a separation under the knee cap. Denies previous knee injury. Treated with ice, elevation and Motrin. Pt is ambulatory with pain.

## 2019-03-03 ENCOUNTER — Other Ambulatory Visit: Payer: Self-pay | Admitting: Family Medicine

## 2019-03-03 ENCOUNTER — Ambulatory Visit
Admission: RE | Admit: 2019-03-03 | Discharge: 2019-03-03 | Disposition: A | Discharge status: Home / Self Care | Payer: Managed Care, Other (non HMO) | Source: Ambulatory Visit | Attending: Family Medicine | Admitting: Family Medicine
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
## 2021-11-05 ENCOUNTER — Other Ambulatory Visit: Payer: Self-pay | Admitting: Sports Medicine

## 2021-11-05 DIAGNOSIS — M545 Low back pain, unspecified: Secondary | ICD-10-CM

## 2021-11-28 ENCOUNTER — Ambulatory Visit
Admission: RE | Admit: 2021-11-28 | Discharge: 2021-11-28 | Disposition: A | Discharge status: Home / Self Care | Payer: 59 | Source: Ambulatory Visit | Attending: Sports Medicine | Admitting: Sports Medicine

## 2021-11-28 DIAGNOSIS — M545 Low back pain, unspecified: Secondary | ICD-10-CM

## 2024-04-13 ENCOUNTER — Ambulatory Visit: Payer: Self-pay | Admitting: Allergy and Immunology
